# Patient Record
Sex: Male | Born: 1950 | Race: White | Hispanic: No | Marital: Single | Smoking: Current every day smoker
Health system: Southern US, Community
[De-identification: ages and names within clinical notes are randomized; demographics above are authoritative.]

## PROBLEM LIST (undated history)

## (undated) DIAGNOSIS — I7090 Unspecified atherosclerosis: Secondary | ICD-10-CM

## (undated) DIAGNOSIS — G250 Essential tremor: Secondary | ICD-10-CM

## (undated) HISTORY — DX: Essential tremor: G25.0

## (undated) HISTORY — DX: Unspecified atherosclerosis: I70.90

---

## 2019-12-03 ENCOUNTER — Emergency Department (INDEPENDENT_AMBULATORY_CARE_PROVIDER_SITE_OTHER)
Admission: EM | Admit: 2019-12-03 | Discharge: 2019-12-03 | Disposition: A | Discharge status: Home / Self Care | Payer: Medicare Other | Source: Home / Self Care | Attending: Emergency Medicine | Admitting: Emergency Medicine

## 2019-12-03 ENCOUNTER — Other Ambulatory Visit: Payer: Self-pay

## 2019-12-03 ENCOUNTER — Encounter: Payer: Self-pay | Admitting: Emergency Medicine

## 2019-12-03 DIAGNOSIS — M79604 Pain in right leg: Secondary | ICD-10-CM

## 2019-12-03 DIAGNOSIS — I10 Essential (primary) hypertension: Secondary | ICD-10-CM

## 2019-12-03 DIAGNOSIS — I70219 Atherosclerosis of native arteries of extremities with intermittent claudication, unspecified extremity: Secondary | ICD-10-CM

## 2019-12-03 MED ORDER — AMLODIPINE BESYLATE 2.5 MG PO TABS
2.5000 mg | ORAL_TABLET | Freq: Every day | ORAL | 0 refills | Status: DC
Start: 2019-12-03 — End: 2019-12-04

## 2019-12-03 NOTE — Discharge Instructions (Addendum)
You likely have "intermittent claudication" from decreased circulation right leg.  No sign that you are having an acute stroke right now.  You also have high blood pressure.  For now, starting you on Norvasc/amlodipine, 1 a day to help blood pressure and circulation.  You need to establish with and follow-up with PCP within 1 to 2 days. If any worsening severe symptoms, go to emergency room immediately. Please read attached instruction sheets on hypertension and claudication.

## 2019-12-03 NOTE — ED Provider Notes (Signed)
[facility]    CSN: [id number] Arrival date & time: 12/03/19  0930      History   Chief Complaint Chief Complaint  Patient presents with  . Leg Problem  Jason Carson complains of R leg "weak" x 5 days.  HPI Jason Carson is a 69 y.o. male.   HPI As a background, he has been active all his life.  Noted both legs have felt "tired" in the past weeks, but complains of new right leg "weakness", onset 5 days ago.  Recalls no trauma or change of activity. While walking around 5 days ago, felt some vague numbness right anterior groin area, but that resolved on its own within a few minutes. Then, 4 days ago he awoke and felt a vague heaviness diffusely right leg, but that resolved 1 hour later on its own.  Since then, if he walks 20 feet, entire right leg feels a dull ache and feels like it is weak but it does not give out.  Feels like right leg "has already run a mile". Occ numbness R foot , but not now. Noted coolness in R foot.  He has no established PCP. Denies any history of stroke or cardiovascular disease, but after questioning, he admits he was told when he went to the dentist that BP elevated, but he never established to have it rechecked as a PCP.  Smokes a pack a day.  Drinks alcohol, but not to excess.  Pertinent negative review of systems: No new shortness of breath or cough.  No syncope or headache or vision change.  Denies fever or chills or nausea or vomiting.  He has been fully vaccinated for Covid months ago.  Denies any exposure to anyone with known Covid diagnosis or symptoms.  Denies any significant back or hip pain.  History reviewed. No pertinent past medical history.  There are no problems to display for this patient.   History reviewed. No pertinent surgical history.     Home Medications    Prior to Admission medications   Medication Sig Start Date End Date Taking? Authorizing Provider  amLODipine (NORVASC) 2.5 MG tablet Take 1 tablet (2.5 mg  total) by mouth daily. For blood pressure and to improve circulation right leg.  Any further refills would need to be done by PCP. 12/03/19   Lajean Manes, MD    Family History Family History  Problem Relation Age of Onset  . Hypertension Mother   . Hyperlipidemia Mother     Social History Social History   Tobacco Use  . Smoking status: Current Every Day Smoker    Types: Cigarettes  . Smokeless tobacco: Never Used  Vaping Use  . Vaping Use: Never used  Substance Use Topics  . Alcohol use: Yes  . Drug use: Not on file     Allergies   Patient has no known allergies.   Review of Systems Review of Systems  Pertinent items noted in HPI and remainder of comprehensive ROS otherwise negative.  Physical Exam Triage Vital Signs ED Triage Vitals  Enc Vitals Group     BP 12/03/19 1039 (!) 184/93     Pulse Rate 12/03/19 1039 77     Resp 12/03/19 1039 18     Temp 12/03/19 1039 99.1 F (37.3 C)     Temp Source 12/03/19 1039 Oral     SpO2 12/03/19 1039 96 %     Weight 12/03/19 1040 170 lb (77.1 kg)     Height 12/03/19 1040 6' (  1.829 m)     Head Circumference --      Peak Flow --      Pain Score 12/03/19 1040 0     Pain Loc --      Pain Edu? --      Excl. in GC? --    No data found.  Updated Vital Signs BP (!) 184/93 (BP Location: Right Arm)   Pulse 77   Temp 99.1 F (37.3 C) (Oral)   Resp 18   Ht 6' (1.829 m)   Wt 77.1 kg   SpO2 96%   BMI 23.06 kg/m   Visual Acuity Right Eye Distance:   Left Eye Distance:   Bilateral Distance:  Grossly intact    Physical Exam Vitals reviewed.  Constitutional:      General: He is not in acute distress.    Appearance: He is well-developed.  HENT:     Head: Normocephalic and atraumatic.     Mouth/Throat:     Pharynx: Oropharynx is clear.  Eyes:     General: No scleral icterus.       Right eye: No discharge.        Left eye: No discharge.     Extraocular Movements: Extraocular movements intact.     Pupils: Pupils  are equal, round, and reactive to light.  Cardiovascular:     Rate and Rhythm: Normal rate and regular rhythm.  Pulmonary:     Effort: Pulmonary effort is normal.     Breath sounds: Normal breath sounds.  Abdominal:     General: There is no distension.     Tenderness: There is no abdominal tenderness.  Musculoskeletal:        General: No tenderness, deformity or signs of injury. Normal range of motion.     Cervical back: Normal range of motion and neck supple.     Right lower leg: No edema.     Left lower leg: No edema.     Right foot: Normal capillary refill. No swelling, foot drop or tenderness.     Left foot: Normal capillary refill. No swelling, foot drop or tenderness.     Comments: Left foot: DP pulse 2+.  Capillary refill normal.  Normal temperature. Right foot: DP pulse 1+ capillary refill normal.  TEMPERATURE R FOOT mildly COOLER COMPARED TO L.   Skin:    General: Skin is warm and dry.     Coloration: Skin is not jaundiced.     Findings: No rash.  Neurological:     General: No focal deficit present.     Mental Status: He is alert and oriented to person, place, and time.     Cranial Nerves: No cranial nerve deficit.     Sensory: No sensory deficit.     Coordination: Coordination normal.     Comments: Gait within normal limits.  Romberg negative. DTRs 2+ and equal bilaterally patella and Achilles. Specific motor testing of hip flexors and flexors and extension of knees and ankles are symmetric, WNL without any motor deficits of the lower extremities.  Psychiatric:        Behavior: Behavior normal.      UC Treatments / Results  Labs (all labs ordered are listed, but only abnormal results are displayed) Labs Reviewed - No data to display  EKG   Radiology No results found.  Procedures Procedures (including critical care time)  Medications Ordered in UC Medications - No data to display  Initial Impression / Assessment and Plan / UC  Course  I have reviewed  the triage vital signs and the nursing notes.  Pertinent labs & imaging results that were available during my care of the patient were reviewed by me and considered in my medical decision making (see chart for details).      Final Clinical Impressions(s) / UC Diagnoses   Final diagnoses:  Right leg pain  Atherosclerotic peripheral vascular disease with intermittent claudication (HCC)  Essential hypertension  5 days of vague weakness right lower extremity intermittently, worse with walking.  Motor strength and neurologic exam normal, no evidence of acute stroke. He does have significant elevated BP which places him at risk for cardiovascular event. His history and physical exam is more compatible with claudication from peripheral vascular disease, risk factors of elevated BP and smoking.  Discussed with him at length and explained red flags that would require immediate evaluation at emergency room. Starting on low-dose amlodipine, hopefully that will help lower BP and, as a vasodilator, hopefully will help peripheral vascular disease. Urged him to quit smoking. Urged him to establish with PCP within the next 2 days and our nurses will attempt to help facilitate that.-See other instructions below.  Questions invited and answered.   Discharge Instructions     You likely have "intermittent claudication" from decreased circulation right leg.  No sign that you are having an acute stroke right now.  You also have high blood pressure.  For now, starting you on Norvasc/amlodipine, 1 a day to help blood pressure and circulation.  You need to establish with and follow-up with PCP within 1 to 2 days. If any worsening severe symptoms, go to emergency room immediately. Please read attached instruction sheets on hypertension and claudication.    ED Prescriptions    Medication Sig Dispense Auth. Provider   amLODipine (NORVASC) 2.5 MG tablet Take 1 tablet (2.5 mg total) by mouth daily. For blood  pressure and to improve circulation right leg.  Any further refills would need to be done by PCP. 15 tablet Lajean Manes, MD     PDMP not reviewed this encounter.   Lajean Manes, MD 12/03/19 1539

## 2019-12-03 NOTE — ED Triage Notes (Signed)
RT leg is weak x 5 days. Numbness started at his groin and moved downward. He walked around the house but it was like dead weight. Not painful but numb.

## 2019-12-04 ENCOUNTER — Ambulatory Visit (INDEPENDENT_AMBULATORY_CARE_PROVIDER_SITE_OTHER): Payer: Medicare Other | Admitting: Medical-Surgical

## 2019-12-04 ENCOUNTER — Encounter: Payer: Self-pay | Admitting: Medical-Surgical

## 2019-12-04 VITALS — BP 167/100 | HR 84 | Temp 98.7°F | Ht 70.0 in | Wt 171.9 lb

## 2019-12-04 DIAGNOSIS — I10 Essential (primary) hypertension: Secondary | ICD-10-CM | POA: Diagnosis not present

## 2019-12-04 DIAGNOSIS — R202 Paresthesia of skin: Secondary | ICD-10-CM

## 2019-12-04 DIAGNOSIS — Z1159 Encounter for screening for other viral diseases: Secondary | ICD-10-CM | POA: Diagnosis not present

## 2019-12-04 DIAGNOSIS — M79604 Pain in right leg: Secondary | ICD-10-CM | POA: Diagnosis not present

## 2019-12-04 MED ORDER — AMLODIPINE BESYLATE 5 MG PO TABS
5.0000 mg | ORAL_TABLET | Freq: Every day | ORAL | 1 refills | Status: DC
Start: 2019-12-04 — End: 2020-02-09

## 2019-12-04 NOTE — Patient Instructions (Addendum)
Take Amlodipine 2.5mg  daily for 1 week then increase to 5mg  daily.  Check blood pressure daily and keep a log. Goal blood pressure 130/80.  Bring your log and your cuff to your nurse visit in 3 weeks.   DASH Eating Plan DASH stands for "Dietary Approaches to Stop Hypertension." The DASH eating plan is a healthy eating plan that has been shown to reduce high blood pressure (hypertension). It may also reduce your risk for type 2 diabetes, heart disease, and stroke. The DASH eating plan may also help with weight loss. What are tips for following this plan?  General guidelines  Avoid eating more than 2,300 mg (milligrams) of salt (sodium) a day. If you have hypertension, you may need to reduce your sodium intake to 1,500 mg a day.  Limit alcohol intake to no more than 1 drink a day for nonpregnant women and 2 drinks a day for men. One drink equals 12 oz of beer, 5 oz of wine, or 1 oz of hard liquor.  Work with your health care provider to maintain a healthy body weight or to lose weight. Ask what an ideal weight is for you.  Get at least 30 minutes of exercise that causes your heart to beat faster (aerobic exercise) most days of the week. Activities may include walking, swimming, or biking.  Work with your health care provider or diet and nutrition specialist (dietitian) to adjust your eating plan to your individual calorie needs. Reading food labels   Check food labels for the amount of sodium per serving. Choose foods with less than 5 percent of the Daily Value of sodium. Generally, foods with less than 300 mg of sodium per serving fit into this eating plan.  To find whole grains, look for the word "whole" as the first word in the ingredient list. Shopping  Buy products labeled as "low-sodium" or "no salt added."  Buy fresh foods. Avoid canned foods and premade or frozen meals. Cooking  Avoid adding salt when cooking. Use salt-free seasonings or herbs instead of table salt or sea  salt. Check with your health care provider or pharmacist before using salt substitutes.  Do not fry foods. Cook foods using healthy methods such as baking, boiling, grilling, and broiling instead.  Cook with heart-healthy oils, such as olive, canola, soybean, or sunflower oil. Meal planning  Eat a balanced diet that includes: ? 5 or more servings of fruits and vegetables each day. At each meal, try to fill half of your plate with fruits and vegetables. ? Up to 6-8 servings of whole grains each day. ? Less than 6 oz of lean meat, poultry, or fish each day. A 3-oz serving of meat is about the same size as a deck of cards. One egg equals 1 oz. ? 2 servings of low-fat dairy each day. ? A serving of nuts, seeds, or beans 5 times each week. ? Heart-healthy fats. Healthy fats called Omega-3 fatty acids are found in foods such as flaxseeds and coldwater fish, like sardines, salmon, and mackerel.  Limit how much you eat of the following: ? Canned or prepackaged foods. ? Food that is high in trans fat, such as fried foods. ? Food that is high in saturated fat, such as fatty meat. ? Sweets, desserts, sugary drinks, and other foods with added sugar. ? Full-fat dairy products.  Do not salt foods before eating.  Try to eat at least 2 vegetarian meals each week.  Eat more home-cooked food and less restaurant, buffet,  and fast food.  When eating at a restaurant, ask that your food be prepared with less salt or no salt, if possible. What foods are recommended? The items listed may not be a complete list. Talk with your dietitian about what dietary choices are best for you. Grains Whole-grain or whole-wheat bread. Whole-grain or whole-wheat pasta. Brown rice. [place]. Bulgur. Whole-grain and low-sodium cereals. Pita bread. Low-fat, low-sodium crackers. Whole-wheat flour tortillas. Vegetables Fresh or frozen vegetables (raw, steamed, roasted, or grilled). Low-sodium or reduced-sodium tomato  and vegetable juice. Low-sodium or reduced-sodium tomato sauce and tomato paste. Low-sodium or reduced-sodium canned vegetables. Fruits All fresh, dried, or frozen fruit. Canned fruit in natural juice (without added sugar). Meat and other protein foods Skinless chicken or [place]. Ground chicken or [place]. Pork with fat trimmed off. Fish and seafood. Egg whites. Dried beans, peas, or lentils. Unsalted nuts, nut butters, and seeds. Unsalted canned beans. Lean cuts of beef with fat trimmed off. Low-sodium, lean deli meat. Dairy Low-fat (1%) or fat-free (skim) milk. Fat-free, low-fat, or reduced-fat cheeses. Nonfat, low-sodium ricotta or cottage cheese. Low-fat or nonfat yogurt. Low-fat, low-sodium cheese. Fats and oils Soft margarine without trans fats. Vegetable oil. Low-fat, reduced-fat, or light mayonnaise and salad dressings (reduced-sodium). Canola, safflower, olive, soybean, and sunflower oils. Avocado. Seasoning and other foods Herbs. Spices. Seasoning mixes without salt. Unsalted popcorn and pretzels. Fat-free sweets. What foods are not recommended? The items listed may not be a complete list. Talk with your dietitian about what dietary choices are best for you. Grains Baked goods made with fat, such as croissants, muffins, or some breads. Dry pasta or rice meal packs. Vegetables Creamed or fried vegetables. Vegetables in a cheese sauce. Regular canned vegetables (not low-sodium or reduced-sodium). Regular canned tomato sauce and paste (not low-sodium or reduced-sodium). Regular tomato and vegetable juice (not low-sodium or reduced-sodium). Angie Fava. Olives. Fruits Canned fruit in a light or heavy syrup. Fried fruit. Fruit in cream or butter sauce. Meat and other protein foods Fatty cuts of meat. Ribs. Fried meat. Berniece Salines. Sausage. Bologna and other processed lunch meats. Salami. Fatback. Hotdogs. Bratwurst. Salted nuts and seeds. Canned beans with added salt. Canned or smoked fish. Whole eggs  or egg yolks. Chicken or [place] with skin. Dairy Whole or 2% milk, cream, and half-and-half. Whole or full-fat cream cheese. Whole-fat or sweetened yogurt. Full-fat cheese. Nondairy creamers. Whipped toppings. Processed cheese and cheese spreads. Fats and oils Butter. Stick margarine. Lard. Shortening. Ghee. Bacon fat. Tropical oils, such as coconut, palm kernel, or palm oil. Seasoning and other foods Salted popcorn and pretzels. Onion salt, garlic salt, seasoned salt, table salt, and sea salt. Worcestershire sauce. Tartar sauce. Barbecue sauce. Teriyaki sauce. Soy sauce, including reduced-sodium. Steak sauce. Canned and packaged gravies. Fish sauce. Oyster sauce. Cocktail sauce. Horseradish that you find on the shelf. Ketchup. Mustard. Meat flavorings and tenderizers. Bouillon cubes. Hot sauce and Tabasco sauce. Premade or packaged marinades. Premade or packaged taco seasonings. Relishes. Regular salad dressings. Where to find more information:  National Heart, Lung, and [place]: https://wilson-eaton.com/  American Heart Association: www.heart.org Summary  The DASH eating plan is a healthy eating plan that has been shown to reduce high blood pressure (hypertension). It may also reduce your risk for type 2 diabetes, heart disease, and stroke.  With the DASH eating plan, you should limit salt (sodium) intake to 2,300 mg a day. If you have hypertension, you may need to reduce your sodium intake to 1,500 mg a day.  When on  the DASH eating plan, aim to eat more fresh fruits and vegetables, whole grains, lean proteins, low-fat dairy, and heart-healthy fats.  Work with your health care provider or diet and nutrition specialist (dietitian) to adjust your eating plan to your individual calorie needs. This information is not intended to replace advice given to you by your health care provider. Make sure you discuss any questions you have with your health care provider. Document Revised: 04/27/2017  Document Reviewed: 05/08/2016 Elsevier Patient Education  2020 [employer].

## 2019-12-04 NOTE — Progress Notes (Signed)
New Patient Office Visit  Subjective:  Patient ID: Jason Carson, male    DOB: Oct 26, 1950  Age: 69 y.o. MRN: [id number]  CC:  Chief Complaint  Patient presents with  . Establish Care  . Follow-up    [facility]  . Hypertension  . Leg Pain    HPI Jason Carson is a pleasant 68 year old male presenting today to establish care. He was seen yesterday at [facility] for right leg pain/weakness/paresthesia. He also had an incidental finding of hypertension. UC started him on Amlodipine 2.5mg  daily. He took his first dose yesterday, tolerated well without side effects. He does not note any change in his leg pain. Blood pressure still elevated today.   Patient admits to receiving no primary or preventative care for many years. Cannot remember when he had his last physical exam. History of benign essential tremor that affects bilateral hands and occasionally his voice. Was given a medication about 30 years ago that he tried but stopped as he didn't like the way it made him feel. Tremor is untreated but he describes it as "annoying but not debilitating". Also has a history of severe periodontal disease and uses chlorhexidine mouth rinse daily per his dentist's instructions.   Right lower extremity symptoms started on Saturday evening with a small area of his upper thigh near the inguinal crease going numb. He was able to feel sensation above and below the spot at that time. The numbness resolved and he went to bed. On awakening Sunday morning, his entire right leg was numb. This did resolve itself shortly after and since then he has had numbness/tingling in the lower leg. Pain occurs with ambulation and he is unable to walk for any distance before requiring rest. Normally is very active but now is riding his golf cart around his property to stay occupied.  Past Medical History:  Diagnosis Date  . Atherosclerosis   . Benign essential tremor     History reviewed. No pertinent surgical  history.  Family History  Problem Relation Age of Onset  . Hypertension Mother   . Hyperlipidemia Mother     Social History   Socioeconomic History  . Marital status: Divorced    Spouse name: Not on file  . Number of children: Not on file  . Years of education: Not on file  . Highest education level: Not on file  Occupational History  . Not on file  Tobacco Use  . Smoking status: Current Every Day Smoker    Packs/day: 1.25    Types: Cigarettes  . Smokeless tobacco: Never Used  Vaping Use  . Vaping Use: Never used  Substance and Sexual Activity  . Alcohol use: Yes    Alcohol/week: 10.0 - 12.0 standard drinks    Types: 10 - 12 Standard drinks or equivalent per week  . Drug use: Never  . Sexual activity: Yes  Other Topics Concern  . Not on file  Social History Narrative  . Not on file   Social Determinants of Health   Financial Resource Strain:   . Difficulty of Paying Living Expenses:   Food Insecurity:   . Worried About Programme researcher, broadcasting/film/video in the Last Year:   . Barista in the Last Year:   Transportation Needs:   . Freight forwarder (Medical):   Marland Kitchen Lack of Transportation (Non-Medical):   Physical Activity:   . Days of Exercise per Week:   . Minutes of Exercise per Session:   Stress:   .  Feeling of Stress :   Social Connections:   . Frequency of Communication with Friends and Family:   . Frequency of Social Gatherings with Friends and Family:   . Attends Religious Services:   . Active Member of Clubs or Organizations:   . Attends Banker Meetings:   Marland Kitchen Marital Status:   Intimate Partner Violence:   . Fear of Current or Ex-Partner:   . Emotionally Abused:   Marland Kitchen Physically Abused:   . Sexually Abused:     ROS Review of Systems  Constitutional: Negative for chills, fatigue, fever and unexpected weight change.  Respiratory: Negative for cough, chest tightness, shortness of breath and wheezing.   Cardiovascular: Negative for chest  pain, palpitations and leg swelling.  Endocrine: Negative for cold intolerance and heat intolerance.  Neurological: Positive for weakness (right leg) and numbness (right lower leg). Negative for dizziness, light-headedness and headaches.  Psychiatric/Behavioral: Positive for sleep disturbance (frequent waking for nocturia). Negative for dysphoric mood, self-injury and suicidal ideas. The patient is not nervous/anxious.     Objective:   Today's Vitals: BP (!) 167/100   Pulse 84   Temp 98.7 F (37.1 C) (Oral)   Ht 5\' 10"  (1.778 m)   Wt 171 lb 14.4 oz (78 kg)   SpO2 97%   BMI 24.67 kg/m   Physical Exam Vitals reviewed.  Constitutional:      General: He is not in acute distress.    Appearance: Normal appearance. He is normal weight.  HENT:     Head: Normocephalic and atraumatic.  Neck:     Vascular: No carotid bruit.  Cardiovascular:     Rate and Rhythm: Normal rate and regular rhythm.     Pulses: Normal pulses.     Heart sounds: Normal heart sounds. No murmur heard.  No friction rub. No gallop.   Pulmonary:     Effort: No respiratory distress.     Breath sounds: Normal breath sounds.  Musculoskeletal:        General: No tenderness. Normal range of motion.     Cervical back: Normal range of motion and neck supple. No tenderness.     Right lower leg: No edema.     Left lower leg: No edema.  Skin:    General: Skin is warm and dry.  Neurological:     Mental Status: He is alert and oriented to person, place, and time.     Cranial Nerves: No cranial nerve deficit.     Gait: Gait normal.  Psychiatric:        Mood and Affect: Mood normal.        Behavior: Behavior normal.        Thought Content: Thought content normal.        Judgment: Judgment normal.     Assessment & Plan:   1. Essential hypertension Checking CBC, CMP, TSH, and Lipid panel today. Continue Amlodipine 2.5mg  daily for 1 week then increase to 5mg  dialy. Recommend obtaining a BP cuff that measures on the  upper arm. Check BP daily and maintain a log. Bring cuff and log to nurse visit after being on 5mg  dose for 2 weeks. DASH diet information provided with AVS.  - CBC - COMPLETE METABOLIC PANEL WITH GFR - Lipid panel - TSH  2. Need for hepatitis C screening test Discussed hepatitis C screening recommendations. Patient agreeable so adding to blood work today. - Hepatitis C antibody  3. Paresthesia of right lower extremity Intermittent claudication suspected by UC  provider which may see improvement with CCB therapy. Checking Vitamin B12 and CK.  - Vitamin B12   Outpatient Encounter Medications as of 12/04/2019  Medication Sig  . amLODipine (NORVASC) 5 MG tablet Take 1 tablet (5 mg total) by mouth daily.  . [DISCONTINUED] amLODipine (NORVASC) 2.5 MG tablet Take 1 tablet (2.5 mg total) by mouth daily. For blood pressure and to improve circulation right leg.  Any further refills would need to be done by PCP.   No facility-administered encounter medications on file as of 12/04/2019.    Follow-up: Return in about 3 weeks (around 12/25/2019) for nurse visit for BP check.   Thayer Ohm, DNP, APRN, FNP-BC [facility] and Sports Medicine

## 2019-12-10 ENCOUNTER — Encounter: Payer: Self-pay | Admitting: Medical-Surgical

## 2019-12-10 LAB — COMPLETE METABOLIC PANEL WITH GFR
AG Ratio: 2.1 (calc) (ref 1.0–2.5)
ALT: 15 U/L (ref 9–46)
AST: 15 U/L (ref 10–35)
Albumin: 4.4 g/dL (ref 3.6–5.1)
Alkaline phosphatase (APISO): 66 U/L (ref 35–144)
BUN: 11 mg/dL (ref 7–25)
CO2: 29 mmol/L (ref 20–32)
Calcium: 9.4 mg/dL (ref 8.6–10.3)
Chloride: 98 mmol/L (ref 98–110)
Creat: 0.99 mg/dL (ref 0.70–1.25)
GFR, Est African American: 90 mL/min/{1.73_m2} (ref >=60)
GFR, Est Non African American: 77 mL/min/{1.73_m2} (ref >=60)
Globulin: 2.1 g/dL (calc) (ref 1.9–3.7)
Glucose, Bld: 101 mg/dL — ABNORMAL HIGH (ref 65–99)
Potassium: 5.5 mmol/L — ABNORMAL HIGH (ref 3.5–5.3)
Sodium: 135 mmol/L (ref 135–146)
Total Bilirubin: 0.6 mg/dL (ref 0.2–1.2)
Total Protein: 6.5 g/dL (ref 6.1–8.1)

## 2019-12-10 LAB — CBC
HCT: 47.5 % (ref 38.5–50.0)
Hemoglobin: 16.9 g/dL (ref 13.2–17.1)
MCH: 36.1 pg — ABNORMAL HIGH (ref 27.0–33.0)
MCHC: 35.6 g/dL (ref 32.0–36.0)
MCV: 101.5 fL — ABNORMAL HIGH (ref 80.0–100.0)
MPV: 10.4 fL (ref 7.5–12.5)
Platelets: 244 10*3/uL (ref 140–400)
RBC: 4.68 10*6/uL (ref 4.20–5.80)
RDW: 11.7 % (ref 11.0–15.0)
WBC: 6.1 10*3/uL (ref 3.8–10.8)

## 2019-12-10 LAB — LIPID PANEL
Cholesterol: 191 mg/dL (ref <200)
HDL: 63 mg/dL (ref >=40)
LDL Cholesterol (Calc): 114 mg/dL (calc) — ABNORMAL HIGH
Non-HDL Cholesterol (Calc): 128 mg/dL (calc) (ref <130)
Total CHOL/HDL Ratio: 3 (calc) (ref <5.0)
Triglycerides: 56 mg/dL (ref <150)

## 2019-12-10 LAB — HEPATITIS C ANTIBODY
Hepatitis C Ab: NONREACTIVE
SIGNAL TO CUT-OFF: 0.01 (ref <1.00)

## 2019-12-10 LAB — CK: Total CK: 57 U/L (ref 44–196)

## 2019-12-10 LAB — TSH: TSH: 1.53 mIU/L (ref 0.40–4.50)

## 2019-12-10 LAB — VITAMIN B12: Vitamin B-12: 259 pg/mL (ref 200–1100)

## 2019-12-24 ENCOUNTER — Other Ambulatory Visit: Payer: Self-pay | Admitting: Medical-Surgical

## 2019-12-24 ENCOUNTER — Encounter: Payer: Self-pay | Admitting: Medical-Surgical

## 2019-12-24 DIAGNOSIS — R202 Paresthesia of skin: Secondary | ICD-10-CM

## 2019-12-24 NOTE — Progress Notes (Signed)
Continued paresthesias and pain to the right lower extremity. Ordering ABIs to evaluate lower extremity blood flow.

## 2019-12-25 ENCOUNTER — Ambulatory Visit (INDEPENDENT_AMBULATORY_CARE_PROVIDER_SITE_OTHER): Payer: Medicare Other | Admitting: Medical-Surgical

## 2019-12-25 ENCOUNTER — Other Ambulatory Visit: Payer: Self-pay

## 2019-12-25 VITALS — BP 168/105 | HR 89

## 2019-12-25 DIAGNOSIS — I10 Essential (primary) hypertension: Secondary | ICD-10-CM | POA: Diagnosis not present

## 2019-12-25 NOTE — Progress Notes (Signed)
See message from patient with attachment sent on 12/24/2019 for home blood pressure log.   Once medication taken, blood pressures down to goal throughout the day. Continue same dose for now. Will follow up in 1 month.   Thayer Ohm, DNP, APRN, FNP-BC [facility] and [facility] Medicine

## 2019-12-25 NOTE — Progress Notes (Signed)
   Subjective:    Patient ID: Jason Carson, male    DOB: November 28, 1950, 68 y.o.   MRN: [id number]  HPI Patient is here for blood pressure and weight check. Denies trouble sleeping, palpitations, or medication problems. Takes Amlodipine 5 mg QD between 10 AM and 12 PM.     Review of Systems     Objective:   Physical Exam        Assessment & Plan:  Patient's initial blood pressure reading was 178/92. Patient confirmed he has NOT taken his blood pressure medication this morning. Patient's home BP machine read at 168/105. Advised patient this is considered in range with out machine.   Readings as well as diagram patient had sent on MyChart were reviewed with provider. Patient advised to take blood pressure medication earlier in the morning and wait 30 - 60 minutes after taking medication to check blood pressure. I reviewed steps and variables with taking blood pressures at home.   Patient instructed to follow up with PCP in about 1 month for blood pressure.   HM: up to date

## 2019-12-29 ENCOUNTER — Other Ambulatory Visit: Payer: Self-pay | Admitting: Medical-Surgical

## 2019-12-29 ENCOUNTER — Encounter: Payer: Self-pay | Admitting: Medical-Surgical

## 2019-12-29 DIAGNOSIS — R202 Paresthesia of skin: Secondary | ICD-10-CM

## 2020-01-07 ENCOUNTER — Other Ambulatory Visit: Payer: Self-pay

## 2020-01-07 ENCOUNTER — Other Ambulatory Visit: Payer: Self-pay | Admitting: Medical-Surgical

## 2020-01-07 ENCOUNTER — Ambulatory Visit (HOSPITAL_COMMUNITY)
Admission: RE | Admit: 2020-01-07 | Discharge: 2020-01-07 | Disposition: A | Discharge status: Home / Self Care | Payer: Medicare Other | Source: Ambulatory Visit | Attending: Medical-Surgical | Admitting: Medical-Surgical

## 2020-01-07 DIAGNOSIS — R202 Paresthesia of skin: Secondary | ICD-10-CM | POA: Insufficient documentation

## 2020-01-07 DIAGNOSIS — M79604 Pain in right leg: Secondary | ICD-10-CM

## 2020-01-07 DIAGNOSIS — R6889 Other general symptoms and signs: Secondary | ICD-10-CM

## 2020-01-07 NOTE — Progress Notes (Signed)
Results relayed to Marylene Land to be given to [place], NP.

## 2020-01-07 NOTE — Progress Notes (Signed)
Call received from vascular lab during completion of ABI testing.  Right lower extremity ABI 0.44.  Entering urgent referral to vascular surgery for further evaluation and management.  MyChart message sent to patient for notification of results and referral status.

## 2020-01-08 ENCOUNTER — Encounter: Payer: Self-pay | Admitting: Medical-Surgical

## 2020-01-19 ENCOUNTER — Encounter: Payer: Self-pay | Admitting: Medical-Surgical

## 2020-01-20 ENCOUNTER — Ambulatory Visit (INDEPENDENT_AMBULATORY_CARE_PROVIDER_SITE_OTHER): Payer: Medicare Other | Admitting: Vascular Surgery

## 2020-01-20 ENCOUNTER — Other Ambulatory Visit: Payer: Self-pay

## 2020-01-20 ENCOUNTER — Encounter: Payer: Self-pay | Admitting: Vascular Surgery

## 2020-01-20 DIAGNOSIS — I998 Other disorder of circulatory system: Secondary | ICD-10-CM

## 2020-01-20 DIAGNOSIS — I70229 Atherosclerosis of native arteries of extremities with rest pain, unspecified extremity: Secondary | ICD-10-CM | POA: Insufficient documentation

## 2020-01-20 HISTORY — DX: Atherosclerosis of native arteries of extremities with rest pain, unspecified extremity: I70.229

## 2020-01-20 NOTE — Progress Notes (Signed)
Patient name: Jason Carson MRN: [id number] DOB: January 27, 1951 Sex: male  REASON FOR CONSULT: Right lower extremity paresthesias with pain limiting mobility  HPI: Jason Carson is a 69 y.o. male, with history of tobacco abuse that presents for evaluation of right lower extremity paresthesias and pain limiting his mobility. He states all of this started July 4 of this year when he started having some numbness and tingling in the right thigh. He states he can feel it from his buttock all the way into the toe. This severely limits his mobility and he can only walk about 20 feet without stopping. He states he has numbness in the distal half of his foot into all five toes. Pain in the great toe that wakes him up at night. No active tissue loss at this time. Has been smoking a pack a day for 50 years. He lives alone. No previous lower extremity interventions. States really does not have any other medical problems.  Past Medical History:  Diagnosis Date  . Atherosclerosis   . Benign essential tremor     History reviewed. No pertinent surgical history.  Family History  Problem Relation Age of Onset  . Hypertension Mother   . Hyperlipidemia Mother     SOCIAL HISTORY: Social History   Socioeconomic History  . Marital status: Single    Spouse name: Not on file  . Number of children: Not on file  . Years of education: Not on file  . Highest education level: Not on file  Occupational History  . Not on file  Tobacco Use  . Smoking status: Current Every Day Smoker    Packs/day: 1.25    Types: Cigarettes  . Smokeless tobacco: Never Used  Vaping Use  . Vaping Use: Never used  Substance and Sexual Activity  . Alcohol use: Yes    Alcohol/week: 10.0 - 12.0 standard drinks    Types: 10 - 12 Standard drinks or equivalent per week  . Drug use: Never  . Sexual activity: Yes  Other Topics Concern  . Not on file  Social History Narrative  . Not on file   Social Determinants of Health    Financial Resource Strain:   . Difficulty of Paying Living Expenses: Not on file  Food Insecurity:   . Worried About Programme researcher, broadcasting/film/video in the Last Year: Not on file  . Ran Out of Food in the Last Year: Not on file  Transportation Needs:   . Lack of Transportation (Medical): Not on file  . Lack of Transportation (Non-Medical): Not on file  Physical Activity:   . Days of Exercise per Week: Not on file  . Minutes of Exercise per Session: Not on file  Stress:   . Feeling of Stress : Not on file  Social Connections:   . Frequency of Communication with Friends and Family: Not on file  . Frequency of Social Gatherings with Friends and Family: Not on file  . Attends Religious Services: Not on file  . Active Member of Clubs or Organizations: Not on file  . Attends Banker Meetings: Not on file  . Marital Status: Not on file  Intimate Partner Violence:   . Fear of Current or Ex-Partner: Not on file  . Emotionally Abused: Not on file  . Physically Abused: Not on file  . Sexually Abused: Not on file    No Known Allergies  Current Outpatient Medications  Medication Sig Dispense Refill  . amLODipine (NORVASC) 5 MG tablet Take  1 tablet (5 mg total) by mouth daily. 30 tablet 1   No current facility-administered medications for this visit.    REVIEW OF SYSTEMS:  [X]  denotes positive finding, [ ]  denotes negative finding Cardiac  Comments:  Chest pain or chest pressure:    Shortness of breath upon exertion:    Short of breath when lying flat:    Irregular heart rhythm:        Vascular    Pain in calf, thigh, or hip brought on by ambulation: x Right  Pain in feet at night that wakes you up from your sleep:  x Right  Blood clot in your veins:    Leg swelling:         Pulmonary    Oxygen at home:    Productive cough:     Wheezing:         Neurologic    Sudden weakness in arms or legs:     Sudden numbness in arms or legs:     Sudden onset of difficulty speaking  or slurred speech:    Temporary loss of vision in one eye:     Problems with dizziness:         Gastrointestinal    Blood in stool:     Vomited blood:         Genitourinary    Burning when urinating:     Blood in urine:        Psychiatric    Major depression:         Hematologic    Bleeding problems:    Problems with blood clotting too easily:        Skin    Rashes or ulcers:        Constitutional    Fever or chills:      PHYSICAL EXAM: Vitals:   01/20/20 1104  BP: (!) 143/83  Pulse: 86  Resp: 16  Temp: (!) 97.5 F (36.4 C)  SpO2: 95%  Weight: 174 lb (78.9 kg)  Height: 6' (1.829 m)    GENERAL: The patient is a well-nourished male, in no acute distress. The vital signs are documented above. CARDIAC: There is a regular rate and rhythm.  VASCULAR:  Left femoral pulse 2+ palpable Right femoral pulse very weak No palpable pulses in the right foot Left PT 2+ palpable No lower extremity tissue loss PULMONARY: There is good air exchange bilaterally without wheezing or rales. ABDOMEN: Soft and non-tender with normal pitched bowel sounds.  MUSCULOSKELETAL: There are no major deformities or cyanosis. NEUROLOGIC: No focal weakness or paresthesias are detected. SKIN: There are no ulcers or rashes noted. PSYCHIATRIC: The patient has a normal affect.  DATA:   ABIs from 01/07/2020 are 0.44 on the right monophasic and 1.07 on the left triphasic  Assessment/Plan:  69 year old male presents with critical limb ischemia of the right lower extremity with rest pain. His ABI on the right are severely reduced at 0.44 monophasic at the ankle. He has a very weak right femoral pulse and suspect he has some component of iliac disease or common femoral disease. I subsequently recommended aortogram with lower extremity arteriogram and possible intervention. We talked about if there is an option for endovascular intervention we will try and intervene at the time of arteriogram. Sometimes  open surgery and or bypass is required at a later date.  Risk and benefits were discussed in detail including risk of groin access complication or vessel injury etc. Discussed this would be done  under moderate sedation at [facility] in the Cath Lab. We will get him scheduled for next week. I discussed the importance of smoking cessation.   Cephus Shelling, MD [facility] Office: [phone]

## 2020-01-21 ENCOUNTER — Encounter: Payer: Self-pay | Admitting: *Deleted

## 2020-01-21 ENCOUNTER — Other Ambulatory Visit: Payer: Self-pay | Admitting: *Deleted

## 2020-01-26 ENCOUNTER — Ambulatory Visit: Payer: Medicare Other | Admitting: Medical-Surgical

## 2020-01-27 ENCOUNTER — Other Ambulatory Visit (HOSPITAL_COMMUNITY)
Admission: RE | Admit: 2020-01-27 | Discharge: 2020-01-27 | Disposition: A | Discharge status: Home / Self Care | Payer: Medicare Other | Source: Ambulatory Visit | Attending: Vascular Surgery | Admitting: Vascular Surgery

## 2020-01-27 DIAGNOSIS — Z20822 Contact with and (suspected) exposure to covid-19: Secondary | ICD-10-CM | POA: Insufficient documentation

## 2020-01-27 DIAGNOSIS — Z01812 Encounter for preprocedural laboratory examination: Secondary | ICD-10-CM | POA: Insufficient documentation

## 2020-01-27 LAB — SARS CORONAVIRUS 2 (TAT 6-24 HRS): SARS Coronavirus 2: NEGATIVE

## 2020-01-28 ENCOUNTER — Encounter (HOSPITAL_COMMUNITY)
Admission: RE | Disposition: A | Discharge status: Home / Self Care | Payer: Self-pay | Source: Home / Self Care | Attending: Vascular Surgery

## 2020-01-28 ENCOUNTER — Other Ambulatory Visit: Payer: Self-pay

## 2020-01-28 ENCOUNTER — Ambulatory Visit (HOSPITAL_COMMUNITY)
Admission: RE | Admit: 2020-01-28 | Discharge: 2020-01-28 | Disposition: A | Discharge status: Home / Self Care | Payer: Medicare Other | Attending: Vascular Surgery | Admitting: Vascular Surgery

## 2020-01-28 DIAGNOSIS — I70223 Atherosclerosis of native arteries of extremities with rest pain, bilateral legs: Secondary | ICD-10-CM | POA: Insufficient documentation

## 2020-01-28 DIAGNOSIS — I70212 Atherosclerosis of native arteries of extremities with intermittent claudication, left leg: Secondary | ICD-10-CM | POA: Diagnosis not present

## 2020-01-28 DIAGNOSIS — I70221 Atherosclerosis of native arteries of extremities with rest pain, right leg: Secondary | ICD-10-CM | POA: Diagnosis not present

## 2020-01-28 DIAGNOSIS — Z79899 Other long term (current) drug therapy: Secondary | ICD-10-CM | POA: Insufficient documentation

## 2020-01-28 DIAGNOSIS — G250 Essential tremor: Secondary | ICD-10-CM | POA: Insufficient documentation

## 2020-01-28 DIAGNOSIS — F17210 Nicotine dependence, cigarettes, uncomplicated: Secondary | ICD-10-CM | POA: Insufficient documentation

## 2020-01-28 HISTORY — PX: PERIPHERAL VASCULAR INTERVENTION: CATH118257

## 2020-01-28 HISTORY — PX: ABDOMINAL AORTOGRAM W/LOWER EXTREMITY: CATH118223

## 2020-01-28 LAB — POCT I-STAT, CHEM 8
BUN: 11 mg/dL (ref 8–23)
Calcium, Ion: 1.12 mmol/L — ABNORMAL LOW (ref 1.15–1.40)
Chloride: 103 mmol/L (ref 98–111)
Creatinine, Ser: 0.8 mg/dL (ref 0.61–1.24)
Glucose, Bld: 117 mg/dL — ABNORMAL HIGH (ref 70–99)
HCT: 50 % (ref 39.0–52.0)
Hemoglobin: 17 g/dL (ref 13.0–17.0)
Potassium: 4.1 mmol/L (ref 3.5–5.1)
Sodium: 138 mmol/L (ref 135–145)
TCO2: 25 mmol/L (ref 22–32)

## 2020-01-28 LAB — POCT ACTIVATED CLOTTING TIME
Activated Clotting Time: 279 seconds
Activated Clotting Time: 213 seconds
Activated Clotting Time: 153 seconds

## 2020-01-28 SURGERY — ABDOMINAL AORTOGRAM W/LOWER EXTREMITY
Anesthesia: LOCAL

## 2020-01-28 MED ORDER — CLOPIDOGREL BISULFATE 300 MG PO TABS
ORAL_TABLET | ORAL | Status: AC
  Filled 2020-01-28: qty 1

## 2020-01-28 MED ORDER — FENTANYL CITRATE (PF) 100 MCG/2ML IJ SOLN
INTRAMUSCULAR | Status: AC
  Filled 2020-01-28: qty 2

## 2020-01-28 MED ORDER — CLOPIDOGREL BISULFATE 75 MG PO TABS: 75.0000 mg | ORAL_TABLET | Freq: Every day | ORAL | 11 refills | Status: DC

## 2020-01-28 MED ORDER — LIDOCAINE HCL (PF) 1 % IJ SOLN
INTRAMUSCULAR | Status: AC
  Filled 2020-01-28: qty 30

## 2020-01-28 MED ORDER — SODIUM CHLORIDE 0.9% FLUSH: 3.0000 mL | INTRAVENOUS | Status: DC | PRN

## 2020-01-28 MED ORDER — SODIUM CHLORIDE 0.9 % IV SOLN: 250.0000 mL | INTRAVENOUS | Status: DC | PRN

## 2020-01-28 MED ORDER — ATORVASTATIN CALCIUM 10 MG PO TABS: 10.0000 mg | ORAL_TABLET | Freq: Every day | ORAL | 11 refills | Status: DC

## 2020-01-28 MED ORDER — LABETALOL HCL 5 MG/ML IV SOLN
10.0000 mg | INTRAVENOUS | Status: DC | PRN
  Administered 2020-01-28: 10 mg via INTRAVENOUS

## 2020-01-28 MED ORDER — LABETALOL HCL 5 MG/ML IV SOLN
INTRAVENOUS | Status: DC | PRN
  Administered 2020-01-28: 10 mg via INTRAVENOUS

## 2020-01-28 MED ORDER — IODIXANOL 320 MG/ML IV SOLN
INTRAVENOUS | Status: DC | PRN
  Administered 2020-01-28: 190 mL via INTRA_ARTERIAL

## 2020-01-28 MED ORDER — SODIUM CHLORIDE 0.9% FLUSH: 3.0000 mL | Freq: Two times a day (BID) | INTRAVENOUS | Status: DC

## 2020-01-28 MED ORDER — ATORVASTATIN CALCIUM 10 MG PO TABS
10.0000 mg | ORAL_TABLET | Freq: Every day | ORAL | Status: DC
  Administered 2020-01-28: 10 mg via ORAL
  Filled 2020-01-28 (×2): qty 1

## 2020-01-28 MED ORDER — MIDAZOLAM HCL 2 MG/2ML IJ SOLN
INTRAMUSCULAR | Status: AC
  Filled 2020-01-28: qty 2

## 2020-01-28 MED ORDER — HEPARIN (PORCINE) IN NACL 1000-0.9 UT/500ML-% IV SOLN
INTRAVENOUS | Status: AC
  Filled 2020-01-28: qty 1000

## 2020-01-28 MED ORDER — SODIUM CHLORIDE 0.9 % IV SOLN: INTRAVENOUS | Status: DC

## 2020-01-28 MED ORDER — LIDOCAINE HCL (PF) 1 % IJ SOLN
INTRAMUSCULAR | Status: DC | PRN
  Administered 2020-01-28: 13 mL
  Administered 2020-01-28: 14 mL

## 2020-01-28 MED ORDER — LABETALOL HCL 5 MG/ML IV SOLN
INTRAVENOUS | Status: AC
  Filled 2020-01-28: qty 4

## 2020-01-28 MED ORDER — HEPARIN (PORCINE) IN NACL 1000-0.9 UT/500ML-% IV SOLN
INTRAVENOUS | Status: DC | PRN
  Administered 2020-01-28 (×2): 500 mL

## 2020-01-28 MED ORDER — HYDRALAZINE HCL 20 MG/ML IJ SOLN: 5.0000 mg | INTRAMUSCULAR | Status: DC | PRN

## 2020-01-28 MED ORDER — HEPARIN SODIUM (PORCINE) 1000 UNIT/ML IJ SOLN
INTRAMUSCULAR | Status: AC
  Filled 2020-01-28: qty 1

## 2020-01-28 MED ORDER — MIDAZOLAM HCL 2 MG/2ML IJ SOLN
INTRAMUSCULAR | Status: DC | PRN
  Administered 2020-01-28 (×2): 1 mg via INTRAVENOUS

## 2020-01-28 MED ORDER — CLOPIDOGREL BISULFATE 75 MG PO TABS: 75.0000 mg | ORAL_TABLET | Freq: Every day | ORAL | Status: DC

## 2020-01-28 MED ORDER — FENTANYL CITRATE (PF) 100 MCG/2ML IJ SOLN
INTRAMUSCULAR | Status: DC | PRN
  Administered 2020-01-28 (×2): 50 ug via INTRAVENOUS

## 2020-01-28 MED ORDER — ACETAMINOPHEN 325 MG PO TABS: 650.0000 mg | ORAL_TABLET | ORAL | Status: DC | PRN

## 2020-01-28 MED ORDER — ASPIRIN EC 81 MG PO TBEC: 81.0000 mg | DELAYED_RELEASE_TABLET | Freq: Every day | ORAL | 11 refills | Status: AC

## 2020-01-28 MED ORDER — HEPARIN SODIUM (PORCINE) 1000 UNIT/ML IJ SOLN
INTRAMUSCULAR | Status: DC | PRN
  Administered 2020-01-28: 4000 [IU] via INTRAVENOUS
  Administered 2020-01-28: 5000 [IU] via INTRAVENOUS

## 2020-01-28 MED ORDER — CLOPIDOGREL BISULFATE 300 MG PO TABS
ORAL_TABLET | ORAL | Status: DC | PRN
  Administered 2020-01-28: 300 mg via ORAL

## 2020-01-28 MED ORDER — ONDANSETRON HCL 4 MG/2ML IJ SOLN: 4.0000 mg | Freq: Four times a day (QID) | INTRAMUSCULAR | Status: DC | PRN

## 2020-01-28 MED ORDER — CLOPIDOGREL BISULFATE 75 MG PO TABS: 300.0000 mg | ORAL_TABLET | Freq: Once | ORAL | Status: DC

## 2020-01-28 MED ORDER — SODIUM CHLORIDE 0.9 % WEIGHT BASED INFUSION: 1.0000 mL/kg/h | INTRAVENOUS | Status: DC

## 2020-01-28 MED ORDER — ASPIRIN EC 81 MG PO TBEC
81.0000 mg | DELAYED_RELEASE_TABLET | Freq: Every day | ORAL | Status: DC
  Administered 2020-01-28: 81 mg via ORAL
  Filled 2020-01-28: qty 1

## 2020-01-28 SURGICAL SUPPLY — 29 items
BALLN JADE .035 7.0 X 40 (BALLOONS) ×3
BALLN MUSTANG 7X60X75 (BALLOONS) ×3
BALLN MUSTANG 7X80X75 (BALLOONS) ×3
BALLOON JADE .035 7.0 X 40 (BALLOONS) ×2 IMPLANT
BALLOON MUSTANG 7X60X75 (BALLOONS) ×2 IMPLANT
BALLOON MUSTANG 7X80X75 (BALLOONS) ×2 IMPLANT
CATH BEACON 5 .035 65 KMP TIP (CATHETERS) ×3 IMPLANT
CATH CROSS OVER TEMPO 5F (CATHETERS) ×3 IMPLANT
CATH OMNI FLUSH 5F 65CM (CATHETERS) ×3 IMPLANT
DEVICE CONTINUOUS FLUSH (MISCELLANEOUS) ×3 IMPLANT
DEVICE TORQUE .025-.038 (MISCELLANEOUS) ×3 IMPLANT
GLIDEWIRE ADV .035X180CM (WIRE) ×3 IMPLANT
GUIDEWIRE ANGLED .035X150CM (WIRE) ×3 IMPLANT
KIT ENCORE 26 ADVANTAGE (KITS) ×3 IMPLANT
KIT MICROPUNCTURE NIT STIFF (SHEATH) ×3 IMPLANT
KIT PV (KITS) ×3 IMPLANT
SHEATH BRITE TIP 6FR 35CM (SHEATH) ×6 IMPLANT
SHEATH PINNACLE 5F 10CM (SHEATH) ×6 IMPLANT
SHEATH PROBE COVER 6X72 (BAG) ×3 IMPLANT
STENT INNOVA 8X20X130 (Permanent Stent) ×3 IMPLANT
STENT INNOVA 8X40X130 (Permanent Stent) ×3 IMPLANT
STENT INNOVA 8X80X130 (Permanent Stent) ×3 IMPLANT
SYR MEDRAD MARK V 150ML (SYRINGE) ×3 IMPLANT
TRANSDUCER W/STOPCOCK (MISCELLANEOUS) ×3 IMPLANT
TRAY PV CATH (CUSTOM PROCEDURE TRAY) ×3 IMPLANT
WIRE BENTSON .035X145CM (WIRE) ×3 IMPLANT
WIRE ROSEN-J .035X180CM (WIRE) ×3 IMPLANT
WIRE ROSEN-J .035X260CM (WIRE) ×6 IMPLANT
WIRE TORQFLEX AUST .018X40CM (WIRE) ×3 IMPLANT

## 2020-01-28 NOTE — Progress Notes (Signed)
Site area: right groin and left Site Prior to Removal:  Level 0  Pressure Applied For 50 MINUTES    Minutes Beginning at 1540  Manual:   Yes.    Patient Status During Pull:  Stable  Post Pull Groin Site:  Level 0  Post Pull Instructions Given:  Yes.    Post Pull Pulses Present:  Yes.    Dressing Applied:  Yes.    Comments:  Bed rest started at 1640 X 4 hr.

## 2020-01-28 NOTE — H&P (Signed)
History and Physical Interval Note:  01/28/2020 11:38 AM  Jason Carson  has presented today for surgery, with the diagnosis of pvd.  The various methods of treatment have been discussed with the patient and family. After consideration of risks, benefits and other options for treatment, the patient has consented to  Procedure(s): ABDOMINAL AORTOGRAM W/LOWER EXTREMITY (N/A) as a surgical intervention.  The patient's history has been reviewed, patient examined, no change in status, stable for surgery.  I have reviewed the patient's chart and labs.  Questions were answered to the patient's satisfaction.    CLI right leg with rest pain.  Cephus Shelling  Patient name: Jason Carson            MRN: [id number]        DOB: December 11, 1950        Sex: male  REASON FOR CONSULT: Right lower extremity paresthesias with pain limiting mobility  HPI: Jason Carson is a 69 y.o. male, with history of tobacco abuse that presents for evaluation of right lower extremity paresthesias and pain limiting his mobility. He states all of this started July 4 of this year when he started having some numbness and tingling in the right thigh. He states he can feel it from his buttock all the way into the toe. This severely limits his mobility and he can only walk about 20 feet without stopping. He states he has numbness in the distal half of his foot into all five toes. Pain in the great toe that wakes him up at night. No active tissue loss at this time. Has been smoking a pack a day for 50 years. He lives alone. No previous lower extremity interventions. States really does not have any other medical problems.      Past Medical History:  Diagnosis Date  . Atherosclerosis   . Benign essential tremor     History reviewed. No pertinent surgical history.       Family History  Problem Relation Age of Onset  . Hypertension Mother   . Hyperlipidemia Mother     SOCIAL HISTORY: Social History         Socioeconomic History  . Marital status: Single    Spouse name: Not on file  . Number of children: Not on file  . Years of education: Not on file  . Highest education level: Not on file  Occupational History  . Not on file  Tobacco Use  . Smoking status: Current Every Day Smoker    Packs/day: 1.25    Types: Cigarettes  . Smokeless tobacco: Never Used  Vaping Use  . Vaping Use: Never used  Substance and Sexual Activity  . Alcohol use: Yes    Alcohol/week: 10.0 - 12.0 standard drinks    Types: 10 - 12 Standard drinks or equivalent per week  . Drug use: Never  . Sexual activity: Yes  Other Topics Concern  . Not on file  Social History Narrative  . Not on file   Social Determinants of Health      Financial Resource Strain:   . Difficulty of Paying Living Expenses: Not on file  Food Insecurity:   . Worried About Programme researcher, broadcasting/film/video in the Last Year: Not on file  . Ran Out of Food in the Last Year: Not on file  Transportation Needs:   . Lack of Transportation (Medical): Not on file  . Lack of Transportation (Non-Medical): Not on file  Physical Activity:   . Days of Exercise per  Week: Not on file  . Minutes of Exercise per Session: Not on file  Stress:   . Feeling of Stress : Not on file  Social Connections:   . Frequency of Communication with Friends and Family: Not on file  . Frequency of Social Gatherings with Friends and Family: Not on file  . Attends Religious Services: Not on file  . Active Member of Clubs or Organizations: Not on file  . Attends Banker Meetings: Not on file  . Marital Status: Not on file  Intimate Partner Violence:   . Fear of Current or Ex-Partner: Not on file  . Emotionally Abused: Not on file  . Physically Abused: Not on file  . Sexually Abused: Not on file    No Known Allergies  Current Outpatient Medications  Medication Sig Dispense Refill  . amLODipine (NORVASC) 5 MG tablet Take 1 tablet (5 mg total) by  mouth daily. 30 tablet 1   No current facility-administered medications for this visit.    REVIEW OF SYSTEMS:  [X]  denotes positive finding, [ ]  denotes negative finding Cardiac  Comments:  Chest pain or chest pressure:    Shortness of breath upon exertion:    Short of breath when lying flat:    Irregular heart rhythm:        Vascular    Pain in calf, thigh, or hip brought on by ambulation: x Right  Pain in feet at night that wakes you up from your sleep:  x Right  Blood clot in your veins:    Leg swelling:         Pulmonary    Oxygen at home:    Productive cough:     Wheezing:         Neurologic    Sudden weakness in arms or legs:     Sudden numbness in arms or legs:     Sudden onset of difficulty speaking or slurred speech:    Temporary loss of vision in one eye:     Problems with dizziness:         Gastrointestinal    Blood in stool:     Vomited blood:         Genitourinary    Burning when urinating:     Blood in urine:        Psychiatric    Major depression:         Hematologic    Bleeding problems:    Problems with blood clotting too easily:        Skin    Rashes or ulcers:        Constitutional    Fever or chills:      PHYSICAL EXAM:    Vitals:   01/20/20 1104  BP: (!) 143/83  Pulse: 86  Resp: 16  Temp: (!) 97.5 F (36.4 C)  SpO2: 95%  Weight: 174 lb (78.9 kg)  Height: 6' (1.829 m)    GENERAL: The patient is a well-nourished male, in no acute distress. The vital signs are documented above. CARDIAC: There is a regular rate and rhythm.  VASCULAR:  Left femoral pulse 2+ palpable Right femoral pulse very weak No palpable pulses in the right foot Left PT 2+ palpable No lower extremity tissue loss PULMONARY: There is good air exchange bilaterally without wheezing or rales. ABDOMEN: Soft and non-tender with normal pitched bowel sounds.   MUSCULOSKELETAL: There are no major deformities or cyanosis. NEUROLOGIC: No focal weakness or paresthesias are detected. SKIN:  There are no ulcers or rashes noted. PSYCHIATRIC: The patient has a normal affect.  DATA:   ABIs from 01/07/2020 are 0.44 on the right monophasic and 1.07 on the left triphasic  Assessment/Plan:  69 year old male presents with critical limb ischemia of the right lower extremity with rest pain. His ABI on the right are severely reduced at 0.44 monophasic at the ankle. He has a very weak right femoral pulse and suspect he has some component of iliac disease or common femoral disease. I subsequently recommended aortogram with lower extremity arteriogram and possible intervention. We talked about if there is an option for endovascular intervention we will try and intervene at the time of arteriogram. Sometimes open surgery and or bypass is required at a later date.  Risk and benefits were discussed in detail including risk of groin access complication or vessel injury etc. Discussed this would be done under moderate sedation at [facility] in the Cath Lab. We will get him scheduled for next week. I discussed the importance of smoking cessation.   Cephus Shelling, MD [facility] [place] Office: [phone]

## 2020-01-28 NOTE — Op Note (Signed)
Patient name: Jason Carson MRN: [id number] DOB: [birth year] Sex: male  01/28/2020 Pre-operative Diagnosis: Right lower extremity critical limb ischemia with rest pain and left lower extremity short distance lifestyle limiting claudication Post-operative diagnosis:  Same Surgeon:  Cephus Shelling, MD Procedure Performed: 1.  Ultrasound-guided access of left common femoral artery 2.  Aortogram including catheter selection of aorta  3.  Ultrasound-guided access of the right common femoral artery 4.  Right external iliac artery angioplasty with stent placement for chronic total occlusion (8 mm x 80 mm self-expanding Innova postdilated with a 7 mm x 80 mm Mustang) 5.  Left external iliac artery angioplasty with stent placement x2 (8 mm x 40 mm self-expanding Innova and 8 mm x 20 mm self-expanding Innova postdilated with a 7 mm Mustang) 6.  Bilateral lower extremity arteriogram with runoff 7.  95 minutes of monitored moderate conscious sedation time  Indications: Patient is a 69 year old male who was seen in clinic recently for critical limb ischemia of the right lower extremity with rest pain with a severely reduced ABI of 0.44 monophasic at the ankle.  He also is now endorsing some claudication symptoms in the left leg.  He presents today for planned arteriogram possible intervention after risk benefits discussed.  Findings:   Aortogram showed one right and three left renal arteries that were widely patent.  The infrarenal aorta was widely patent.  On the right he had a patent common iliac and hypogastric artery but a total occlusion of the right external iliac with reconstitution of the common femoral and then a flush right SFA occlusion and only profunda runoff on the right.  On the left he had a patent common iliac artery and a high-grade 80% calcified stenosis in the left external iliac that we initially had some difficulty crossing retrograde for sheath access.  After evaluating the  images then accessed the right common femoral artery retrograde and was able to cross the right external iliac occlusion.  This was primarily stented with an 8 mm Innova postdilated with a 7 mm Mustang.  He now has a right femoral pulse .  He also endorses claudication symptoms in the left lower extremity and given access issues with the left external iliac lesion I stentede this with a 8 mm x 40 mm self-expanding Innova and post-dilated this with a 7 mm angioplasty balloon.  The stent was too short and had to place a second 8 mm x 20 mm self expanding Innova.  Bilateral lower extremity runoff after iliac stent placement on the right shows flush SFA occlusion with reconstitution of the distal SFA patent above and below-knee popliteal artery and apparent three-vessel runoff.  On the left he has a patent SFA with some calcific disease but no flow-limiting stenosis as well as a patent above below-knee popliteal artery and two-vessel runoff in the peroneal and posterior tibial.   Procedure:  The patient was identified in the holding area and taken to room 8.  The patient was then placed supine on the table and prepped and draped in the usual sterile fashion.  A time out was called.  Ultrasound was used to evaluate the left common femoral artery.  It was patent .  A digital ultrasound image was acquired.  A micropuncture needle was used to access the left common femoral artery under ultrasound guidance.  An 018 wire was advanced without resistance and a micropuncture sheath was placed.  The 018 wire was removed and a benson wire was placed.  The micropuncture sheath was exchanged for a 5 french sheath.  An omniflush catheter was advanced over the wire to the level of L-1.  An abdominal angiogram was obtained.  Evaluating the images appearred to have a right external iliac occlusion.  I initially tried to probe this antegrade after switching for a crossover catheter from left groin access but my wire kept wanting to  go down the hypogastric and I did not have much of a stump for support.  I then evaluated the right common femoral with ultrasound it was patent and image was saved.  This was accessed with a micro access needle under ultrasound guidance, placed a microwire and then a micro sheath.  I then used a Glidewire advantage and ultimately was able to get partially cross the right external iliac occlusion in order to get a short 5 French sheath in the right groin.  I then used a KMP for additional support and was able to cross the right external iliac back into the true lumen.  I confirmed hand-injection I was in the true lumen in the right common iliac.  He was given 100 units per kilogram heparin and ACT's were checked to maintain greater than 250.  I then measured the healthy external iliac on the contralateral side to select the size of my stent.  I then placed a 6 French bright tip sheath in the right groin and pulled this all the way back into the common femoral and then over my wire I was able to place a 8 mm x 80 mm self-expanding Innova in the right external iliac postdilated this with a 7 mm balloon.  Patient then had a palpable right femoral pulse with excellent inline flow down the right lower extremity.  I then elected to stent the left external iliac given he does endorse worsening claudication symptoms in the left leg and I had some issues getting his sheath in on the left.  Exchanged for 6 French bright tip in left common femoral artery. Subsequently selected an 8 mm x 40 mm self expanding Innova for a focal greater than 80% calcified stenosis in the external iliac on the left.  This was postdilated with a 7 mm balloon and initially appeared to look pretty good.  I then put Omni Flush catheter back up evaluate iliac stents that showed overall good wall apposition with no overt residual stenosis.  I then shot bilateral lower extremity runoff after pulling the sheaths back in the common femorals.  As noted  above he has a flush SFA occlusion on the right and ultimately inline flow down the left lower extremity.  Flow was more sluggish on the left and I was concerned about this and looking back at the images there appeared to be a external iliac lesion just distal to her stent that we did not appropriately cover.  I then selected a second 8 mm x 20 mm self expanding Innova that was deployed just distal to her existing stent in the left external iliac postdilated with a 7 mm balloon.  Appear to have much more brisk flow down the left lower extremity.  That point time sheaths were secured and wires and catheters removed and he will be taken to holding to have his sheaths removed.  Plan: Patient will be loaded on Plavix.  I will arrange follow-up in 1 month with iliac duplex and ABIs.  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  Cephus Shelling, MD [facility] [place] Office: [phone]

## 2020-01-28 NOTE — Discharge Instructions (Signed)
Femoral Site Care This sheet gives you information about how to care for yourself after your procedure. Your health care provider may also give you more specific instructions. If you have problems or questions, contact your health care provider. What can I expect after the procedure? After the procedure, it is common to have:  Bruising that usually fades within 1-2 weeks.  Tenderness at the site. Follow these instructions at home: Wound care  Follow instructions from your health care provider about how to take care of your insertion site. Make sure you: ? Wash your hands with soap and water before you change your bandage (dressing). If soap and water are not available, use hand sanitizer. ? Change your dressing as told by your health care provider. ? Leave stitches (sutures), skin glue, or adhesive strips in place. These skin closures may need to stay in place for 2 weeks or longer. If adhesive strip edges start to loosen and curl up, you may trim the loose edges. Do not remove adhesive strips completely unless your health care provider tells you to do that.  Do not take baths, swim, or use a hot tub until your health care provider approves.  You may shower 24-48 hours after the procedure or as told by your health care provider. ? Gently wash the site with plain soap and water. ? Pat the area dry with a clean towel. ? Do not rub the site. This may cause bleeding.  Do not apply powder or lotion to the site. Keep the site clean and dry.  Check your femoral site every day for signs of infection. Check for: ? Redness, swelling, or pain. ? Fluid or blood. ? Warmth. ? Pus or a bad smell. Activity  For the first 2-3 days after your procedure, or as long as directed: ? Avoid climbing stairs as much as possible. ? Do not squat.  Do not lift anything that is heavier than 10 lb (4.5 kg), or the limit that you are told, until your health care provider says that it is safe.  Rest as  directed. ? Avoid sitting for a long time without moving. Get up to take short walks every 1-2 hours.  Do not drive for 24 hours if you were given a medicine to help you relax (sedative). General instructions  Take over-the-counter and prescription medicines only as told by your health care provider.  Keep all follow-up visits as told by your health care provider. This is important. Contact a health care provider if you have:  A fever or chills.  You have redness, swelling, or pain around your insertion site. Get help right away if:  The catheter insertion area swells very fast.  You pass out.  You suddenly start to sweat or your skin gets clammy.  The catheter insertion area is bleeding, and the bleeding does not stop when you hold steady pressure on the area.  The area near or just beyond the catheter insertion site becomes pale, cool, tingly, or numb. These symptoms may represent a serious problem that is an emergency. Do not wait to see if the symptoms will go away. Get medical help right away. Call your local emergency services (911 in the [place] Do not drive yourself to the hospital. Summary  After the procedure, it is common to have bruising that usually fades within 1-2 weeks.  Check your femoral site every day for signs of infection.  Do not lift anything that is heavier than 10 lb (4.5 kg), or the   limit that you are told, until your health care provider says that it is safe. This information is not intended to replace advice given to you by your health care provider. Make sure you discuss any questions you have with your health care provider. Document Revised: 05/28/2017 Document Reviewed: 05/28/2017 Elsevier Patient Education  2020 [employer].    Start aspirin and Plavix daily.  Aspirin can be 81 mg over-the-counter.  Plavix prescription sent to pharmacy.  Also recommend low-dose statin for cholesterol which has been sent to your pharmacy.

## 2020-01-29 ENCOUNTER — Encounter (HOSPITAL_COMMUNITY): Payer: Self-pay | Admitting: Vascular Surgery

## 2020-02-08 DIAGNOSIS — I10 Essential (primary) hypertension: Secondary | ICD-10-CM | POA: Insufficient documentation

## 2020-02-08 NOTE — Progress Notes (Signed)
Subjective:    CC: HTN follow up  HPI: Pleasant 69 year old male presenting for HTN follow up. Recently underwent an abdominal aortogram with bilateral stents placed to return blood flow to the lower extremities.  Has been doing very well since his procedure and reports his right leg symptoms are 97% improved.  He is able to walk and do normal activities again.  Taking amlodipine 5 mg daily usually around 10 AM each evening each day.  Presents with a printout of his averages.  Blood pressure in the mornings continue to be elevated with improvements in the afternoon and then beautiful readings in the evening.  He has been paying attention to sodium content in his diet and trying to avoid high sodium/processed foods.  Not currently exercising but plans to contact his vascular surgery office to clarify his current restrictions today.  Denies chest pain, palpitations, headaches, dizziness, vision changes.  Mild shortness of breath with exercise, not severe enough to prompt him to stop the activity.  Mild puffiness of bilateral feet and ankles.  I reviewed the past medical history, family history, social history, surgical history, and allergies today and no changes were needed.  Please see the problem list section below in epic for further details.  Past Medical History: Past Medical History:  Diagnosis Date  . Atherosclerosis   . Benign essential tremor    Past Surgical History: Past Surgical History:  Procedure Laterality Date  . ABDOMINAL AORTOGRAM W/LOWER EXTREMITY N/A 01/28/2020   Procedure: ABDOMINAL AORTOGRAM W/LOWER EXTREMITY;  Surgeon: Cephus Shelling, MD;  Location: [facility];  Service: Cardiovascular;  Laterality: N/A;  . PERIPHERAL VASCULAR INTERVENTION Bilateral 01/28/2020   Procedure: PERIPHERAL VASCULAR INTERVENTION;  Surgeon: Cephus Shelling, MD;  Location: [facility];  Service: Cardiovascular;  Laterality: Bilateral;  external iliac stents   Social  History: Social History   Socioeconomic History  . Marital status: Single    Spouse name: Not on file  . Number of children: Not on file  . Years of education: Not on file  . Highest education level: Not on file  Occupational History  . Not on file  Tobacco Use  . Smoking status: Current Every Day Smoker    Packs/day: 1.25    Types: Cigarettes  . Smokeless tobacco: Never Used  Vaping Use  . Vaping Use: Never used  Substance and Sexual Activity  . Alcohol use: Yes    Alcohol/week: 10.0 - 12.0 standard drinks    Types: 10 - 12 Standard drinks or equivalent per week  . Drug use: Never  . Sexual activity: Yes  Other Topics Concern  . Not on file  Social History Narrative  . Not on file   Social Determinants of Health   Financial Resource Strain:   . Difficulty of Paying Living Expenses: Not on file  Food Insecurity:   . Worried About Programme researcher, broadcasting/film/video in the Last Year: Not on file  . Ran Out of Food in the Last Year: Not on file  Transportation Needs:   . Lack of Transportation (Medical): Not on file  . Lack of Transportation (Non-Medical): Not on file  Physical Activity:   . Days of Exercise per Week: Not on file  . Minutes of Exercise per Session: Not on file  Stress:   . Feeling of Stress : Not on file  Social Connections:   . Frequency of Communication with Friends and Family: Not on file  . Frequency of Social Gatherings with  Friends and Family: Not on file  . Attends Religious Services: Not on file  . Active Member of Clubs or Organizations: Not on file  . Attends Banker Meetings: Not on file  . Marital Status: Not on file   Family History: Family History  Problem Relation Age of Onset  . Hypertension Mother   . Hyperlipidemia Mother    Allergies: No Known Allergies Medications: See med rec.  Review of Systems: See HPI for pertinent positives and negatives.   Objective:    General: Well Developed, well nourished, and in no acute  distress.  Neuro: Alert and oriented x3.  HEENT: Normocephalic, atraumatic.  Skin: Warm and dry. Cardiac: Regular rate and rhythm, no murmurs rubs or gallops, no lower extremity edema.  Respiratory: Clear to auscultation bilaterally. Not using accessory muscles, speaking in full sentences.   Impression and Recommendations:    1. Essential hypertension Continue amlodipine 5 mg daily but shifted dosing to nighttime dosing to see if this will make a difference in his morning blood pressures.  I will touch base with him via MyChart in approximately 2 weeks to see if nighttime dosing has made a difference.  If not, we may need to add in a small morning dose of a different agent.  Continue to limit sodium.  Exercise as permitted by vascular surgery. - amLODipine (NORVASC) 5 MG tablet; Take 1 tablet (5 mg total) by mouth daily.  Dispense: 90 tablet; Refill: 1  2. Elevated LDL cholesterol level Continue atorvastatin 10 mg daily.  3. Need for influenza vaccination Flu vaccine given in office. - Flu Vaccine QUAD High Dose(Fluad)  Return for HTN follow up in 3-4 months. ___________________________________________ Thayer Ohm, DNP, APRN, FNP-BC Primary Care and Sports Medicine [facility]

## 2020-02-09 ENCOUNTER — Encounter: Payer: Self-pay | Admitting: Medical-Surgical

## 2020-02-09 ENCOUNTER — Other Ambulatory Visit: Payer: Self-pay

## 2020-02-09 ENCOUNTER — Ambulatory Visit (INDEPENDENT_AMBULATORY_CARE_PROVIDER_SITE_OTHER): Payer: Medicare Other | Admitting: Medical-Surgical

## 2020-02-09 VITALS — BP 172/91 | HR 82 | Temp 98.4°F | Ht 72.0 in | Wt 175.1 lb

## 2020-02-09 DIAGNOSIS — I10 Essential (primary) hypertension: Secondary | ICD-10-CM | POA: Diagnosis not present

## 2020-02-09 DIAGNOSIS — E7800 Pure hypercholesterolemia, unspecified: Secondary | ICD-10-CM | POA: Diagnosis not present

## 2020-02-09 DIAGNOSIS — Z23 Encounter for immunization: Secondary | ICD-10-CM | POA: Diagnosis not present

## 2020-02-09 MED ORDER — AMLODIPINE BESYLATE 5 MG PO TABS: 5.0000 mg | ORAL_TABLET | Freq: Every day | ORAL | 1 refills | Status: DC

## 2020-02-09 NOTE — Patient Instructions (Signed)

## 2020-02-10 ENCOUNTER — Telehealth: Payer: Self-pay | Admitting: *Deleted

## 2020-02-10 NOTE — Telephone Encounter (Signed)
Patient called states he has a small marble size note at groin incision site denies any pain or swelling at site. Instructed patient to call us back if any increase in size or any pain and swelling noted. Patient verbalized understanding. Patient also had question about activity spoke with Dr Chestine Spore he states activity as tolerated is ok. Patient has post op appt already scheduled.

## 2020-02-17 ENCOUNTER — Encounter: Payer: Medicare Other | Admitting: Vascular Surgery

## 2020-02-20 ENCOUNTER — Other Ambulatory Visit: Payer: Self-pay

## 2020-02-20 DIAGNOSIS — I70229 Atherosclerosis of native arteries of extremities with rest pain, unspecified extremity: Secondary | ICD-10-CM

## 2020-02-23 ENCOUNTER — Encounter: Payer: Self-pay | Admitting: Medical-Surgical

## 2020-02-25 MED ORDER — HYDROCHLOROTHIAZIDE 12.5 MG PO TABS: 12.5000 mg | ORAL_TABLET | Freq: Every day | ORAL | 3 refills | Status: DC

## 2020-03-05 ENCOUNTER — Encounter (HOSPITAL_COMMUNITY): Payer: Medicare Other

## 2020-03-05 ENCOUNTER — Encounter: Payer: Medicare Other | Admitting: Vascular Surgery

## 2020-03-09 ENCOUNTER — Encounter: Payer: Self-pay | Admitting: Vascular Surgery

## 2020-03-09 ENCOUNTER — Other Ambulatory Visit: Payer: Self-pay

## 2020-03-09 ENCOUNTER — Ambulatory Visit (HOSPITAL_COMMUNITY)
Admission: RE | Admit: 2020-03-09 | Discharge: 2020-03-09 | Disposition: A | Discharge status: Home / Self Care | Payer: Medicare Other | Source: Ambulatory Visit | Attending: Vascular Surgery | Admitting: Vascular Surgery

## 2020-03-09 ENCOUNTER — Ambulatory Visit (INDEPENDENT_AMBULATORY_CARE_PROVIDER_SITE_OTHER): Payer: Medicare Other | Admitting: Vascular Surgery

## 2020-03-09 ENCOUNTER — Ambulatory Visit (INDEPENDENT_AMBULATORY_CARE_PROVIDER_SITE_OTHER)
Admission: RE | Admit: 2020-03-09 | Discharge: 2020-03-09 | Disposition: A | Discharge status: Home / Self Care | Payer: Medicare Other | Source: Ambulatory Visit | Attending: Vascular Surgery | Admitting: Vascular Surgery

## 2020-03-09 VITALS — BP 161/84 | HR 84 | Temp 98.1°F | Resp 18 | Ht 72.0 in | Wt 175.0 lb

## 2020-03-09 DIAGNOSIS — I70229 Atherosclerosis of native arteries of extremities with rest pain, unspecified extremity: Secondary | ICD-10-CM | POA: Diagnosis present

## 2020-03-09 NOTE — Progress Notes (Signed)
Patient name: Jason Carson MRN: [id number] DOB: 01-31-1951 Sex: male  REASON FOR VISIT: Follow-up after bilateral external iliac artery angioplasty with stent placement  HPI: Jason Carson is a 69 y.o. male with history of peripheral arterial disease and tobacco abuse that presents for follow-up after recent iliac intervention.  Initially he had critical limb ischemia of the right foot with rest pain and an ABI 0.44.  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  Also placed two stents in the left external iliac artery for high grade stenosis as it related to left leg claudication.  He seems very satisfied today and states his right leg feels so much better.  He only complains of some mild numbness in the toe that is not bothersome.  Still smoking a pack a day.  Taking aspirin Plavix.  Past Medical History:  Diagnosis Date  . Atherosclerosis   . Benign essential tremor     Past Surgical History:  Procedure Laterality Date  . ABDOMINAL AORTOGRAM W/LOWER EXTREMITY N/A 01/28/2020   Procedure: ABDOMINAL AORTOGRAM W/LOWER EXTREMITY;  Surgeon: Cephus Shelling, MD;  Location: [facility];  Service: Cardiovascular;  Laterality: N/A;  . PERIPHERAL VASCULAR INTERVENTION Bilateral 01/28/2020   Procedure: PERIPHERAL VASCULAR INTERVENTION;  Surgeon: Cephus Shelling, MD;  Location: [facility];  Service: Cardiovascular;  Laterality: Bilateral;  external iliac stents    Family History  Problem Relation Age of Onset  . Hypertension Mother   . Hyperlipidemia Mother     SOCIAL HISTORY: Social History   Tobacco Use  . Smoking status: Current Every Day Smoker    Packs/day: 1.25    Types: Cigarettes  . Smokeless tobacco: Never Used  Substance Use Topics  . Alcohol use: Yes    Alcohol/week: 10.0 - 12.0 standard drinks    Types: 10 - 12 Standard drinks or equivalent per week    No Known  Allergies  Current Outpatient Medications  Medication Sig Dispense Refill  . amLODipine (NORVASC) 5 MG tablet Take 1 tablet (5 mg total) by mouth daily. 90 tablet 1  . aspirin EC 81 MG tablet Take 1 tablet (81 mg total) by mouth daily. Swallow whole. 30 tablet 11  . atorvastatin (LIPITOR) 10 MG tablet Take 1 tablet (10 mg total) by mouth daily. 30 tablet 11  . chlorhexidine (PERIDEX) 0.12 % solution Use as directed 15 mLs in the mouth or throat at bedtime.    . clopidogrel (PLAVIX) 75 MG tablet Take 1 tablet (75 mg total) by mouth daily. 30 tablet 11  . hydrochlorothiazide (HYDRODIURIL) 12.5 MG tablet Take 1 tablet (12.5 mg total) by mouth daily. 30 tablet 3  . Multiple Vitamin (MULTIVITAMIN WITH MINERALS) TABS tablet Take 1 tablet by mouth daily.    Marland Kitchen PHENYLEPHRINE HCL NA Place 1 spray into the nose daily as needed (congestion).     No current facility-administered medications for this visit.    REVIEW OF SYSTEMS:  [X]  denotes positive finding, [ ]  denotes negative finding Cardiac  Comments:  Chest pain or chest pressure:    Shortness of breath upon exertion:    Short of breath when lying flat:    Irregular heart rhythm:        Vascular    Pain in calf, thigh, or hip brought on by ambulation:    Pain in feet at night that wakes you up from your sleep:     Blood  clot in your veins:    Leg swelling:         Pulmonary    Oxygen at home:    Productive cough:     Wheezing:         Neurologic    Sudden weakness in arms or legs:     Sudden numbness in arms or legs:     Sudden onset of difficulty speaking or slurred speech:    Temporary loss of vision in one eye:     Problems with dizziness:         Gastrointestinal    Blood in stool:     Vomited blood:         Genitourinary    Burning when urinating:     Blood in urine:        Psychiatric    Major depression:         Hematologic    Bleeding problems:    Problems with blood clotting too easily:        Skin    Rashes  or ulcers:        Constitutional    Fever or chills:      PHYSICAL EXAM: Vitals:   03/09/20 0845  BP: (!) 161/84  Pulse: 84  Resp: 18  Temp: 98.1 F (36.7 C)  TempSrc: Temporal  SpO2: 95%  Weight: 175 lb (79.4 kg)  Height: 6' (1.829 m)    GENERAL: The patient is a well-nourished male, in no acute distress. The vital signs are documented above. CARDIAC: There is a regular rate and rhythm.  VASCULAR:  Palpable femoral pulses both groins, no hematoma Left PT palpable No palpable right pedal pulses  DATA:   Bilateral external iliac artery stents are widely patent on arterial duplex today  ABIs on the right are improved from 0.44 to 0.69 and normal on the left at 1.01  Assessment/Plan:  69 year old male status post bilateral external iliac artery intervention for critical limb ischemia with rest pain in the right lower extremity and claudication in the left leg.  Ultimately has had complete resolution of symptoms and is very satisfied with the results.  He has easily palpable femoral pulses.  His bilateral external iliac stents are widely patent on duplex today.  He does have a right SFA occlusion but given symptom resolution with treatment of his right external iliac chronic total occlusion there is no role for further intervention at this time and would ultimately require a bypass.  Discussed that he stay on aspirin Plavix for dual antiplatelet therapy.  Discussed the importance of smoking cessation.  I will have him follow-up in 6 months with aortoiliac duplex and ABIs for ongoing surveillance.  He knows to call if there is a problem sooner.   Cephus Shelling, MD [facility] Office: [phone]

## 2020-03-15 ENCOUNTER — Other Ambulatory Visit: Payer: Self-pay | Admitting: *Deleted

## 2020-03-15 DIAGNOSIS — I70229 Atherosclerosis of native arteries of extremities with rest pain, unspecified extremity: Secondary | ICD-10-CM

## 2020-03-25 ENCOUNTER — Encounter: Payer: Self-pay | Admitting: Medical-Surgical

## 2020-03-29 ENCOUNTER — Encounter: Payer: Self-pay | Admitting: Medical-Surgical

## 2020-03-29 MED ORDER — DOXAZOSIN MESYLATE 1 MG PO TABS: 1.0000 mg | ORAL_TABLET | Freq: Every day | ORAL | 1 refills | Status: DC

## 2020-04-12 ENCOUNTER — Other Ambulatory Visit: Payer: Self-pay | Admitting: Medical-Surgical

## 2020-04-12 MED ORDER — DOXAZOSIN MESYLATE 2 MG PO TABS: 2.0000 mg | ORAL_TABLET | Freq: Every day | ORAL | 1 refills | Status: DC

## 2020-05-10 ENCOUNTER — Encounter: Payer: Self-pay | Admitting: Medical-Surgical

## 2020-05-10 ENCOUNTER — Other Ambulatory Visit: Payer: Self-pay

## 2020-05-10 ENCOUNTER — Ambulatory Visit (INDEPENDENT_AMBULATORY_CARE_PROVIDER_SITE_OTHER): Payer: Medicare Other | Admitting: Medical-Surgical

## 2020-05-10 VITALS — BP 164/88 | HR 64 | Temp 98.6°F | Ht 72.0 in | Wt 183.6 lb

## 2020-05-10 DIAGNOSIS — I10 Essential (primary) hypertension: Secondary | ICD-10-CM

## 2020-05-10 NOTE — Patient Instructions (Signed)
Take 100m of Doxazosin (Cardura) at bedtime.  Be on the lookout for home sleep study kit to come to your address!

## 2020-05-10 NOTE — Progress Notes (Signed)
Subjective:    CC: HTN, PVD follow up  HPI: Pleasant 69 year old male presenting for follow up on PVD and HTN.   HTN- Taking Amlodipine 5mg  daily in the morning and Cardura 1mg  nightly. Has not started the 2mg  dose of Cardura yet. BP still elevated in the mornings and normal in the afternoons and evenings. Smoking approximately 1 ppd of cigarettes. Does snore from previous reports but has not been witnessed having apnea. No previous sleep study or CPAP use. Drinks 0-3 alcoholic beverages most nights, mix of beer, wine, and liquor.   PVD- doing very well post-stent placement. Has returned to previous activity levels and admits that he doesn't think about his leg problem most days.   I reviewed the past medical history, family history, social history, surgical history, and allergies today and no changes were needed.  Please see the problem list section below in epic for further details.  Past Medical History: Past Medical History:  Diagnosis Date  . Atherosclerosis   . Benign essential tremor    Past Surgical History: Past Surgical History:  Procedure Laterality Date  . ABDOMINAL AORTOGRAM W/LOWER EXTREMITY N/A 01/28/2020   Procedure: ABDOMINAL AORTOGRAM W/LOWER EXTREMITY;  Surgeon: , MD;  Location: [facility];  Service: Cardiovascular;  Laterality: N/A;  . PERIPHERAL VASCULAR INTERVENTION Bilateral 01/28/2020   Procedure: PERIPHERAL VASCULAR INTERVENTION;  Surgeon: 03/29/2020, MD;  Location: [facility];  Service: Cardiovascular;  Laterality: Bilateral;  external iliac stents   Social History: Social History   Socioeconomic History  . Marital status: Single    Spouse name: Not on file  . Number of children: Not on file  . Years of education: Not on file  . Highest education level: Not on file  Occupational History  . Not on file  Tobacco Use  . Smoking status: Current Every Day Smoker    Packs/day: 1.25    Types: Cigarettes  .  Smokeless tobacco: Never Used  Vaping Use  . Vaping Use: Never used  Substance and Sexual Activity  . Alcohol use: Yes    Alcohol/week: 10.0 - 12.0 standard drinks    Types: 10 - 12 Standard drinks or equivalent per week  . Drug use: Never  . Sexual activity: Yes  Other Topics Concern  . Not on file  Social History Narrative  . Not on file   Social Determinants of Health   Financial Resource Strain: Not on file  Food Insecurity: Not on file  Transportation Needs: Not on file  Physical Activity: Not on file  Stress: Not on file  Social Connections: Not on file   Family History: Family History  Problem Relation Age of Onset  . Hypertension Mother   . Hyperlipidemia Mother    Allergies: No Known Allergies Medications: See med rec.  Review of Systems: See HPI for pertinent positives and negatives.   Objective:    General: Well Developed, well nourished, and in no acute distress.  Neuro: Alert and oriented x3.  HEENT: Normocephalic, atraumatic.  Skin: Warm and dry. Cardiac: Regular rate and rhythm, no murmurs rubs or gallops, no lower extremity edema.  Respiratory: Clear to auscultation bilaterally. Not using accessory muscles, speaking in full sentences.  Impression and Recommendations:    1. Essential hypertension Continue Amlodipine 5mg  daily. Increase Cardura to 2mg  nightly. Home sleep study. Continue monitoring BP at home. Recommend smoking cessation. Discussed alcohol intake recommendations. - Home sleep test  Return for HTN follow up via MyChart in  2 weeks. ___________________________________________ Thayer Ohm, DNP, APRN, FNP-BC Primary Care and Sports Medicine [facility]

## 2020-05-20 MED ORDER — DOXAZOSIN MESYLATE 8 MG PO TABS: 8.0000 mg | ORAL_TABLET | Freq: Every day | ORAL | 1 refills | Status: DC

## 2020-05-24 ENCOUNTER — Telehealth: Payer: Medicare Other | Admitting: Medical-Surgical

## 2020-06-14 MED ORDER — AMLODIPINE BESYLATE 2.5 MG PO TABS: 2.5000 mg | ORAL_TABLET | Freq: Every day | ORAL | 1 refills | Status: DC

## 2020-06-14 NOTE — Addendum Note (Signed)
Addended byChristen Butter on: 06/14/2020 09:10 AM   Modules accepted: Orders

## 2020-07-07 ENCOUNTER — Telehealth: Payer: Self-pay

## 2020-07-07 NOTE — Telephone Encounter (Signed)
Patient has fasting study scheduled for 5/17 - called to report taking Gas X "blows him up like a balloon" and the last time they did the study they were unable to visualize well. Discussed with lab personnel and they said that patient could skip the Gas X, but no eating after midnight, no chewing gum or smoking. Patient verbalizes understanding.

## 2020-08-06 ENCOUNTER — Encounter: Payer: Self-pay | Admitting: Medical-Surgical

## 2020-08-09 ENCOUNTER — Encounter: Payer: Self-pay | Admitting: Medical-Surgical

## 2020-08-09 ENCOUNTER — Other Ambulatory Visit: Payer: Self-pay

## 2020-08-09 ENCOUNTER — Ambulatory Visit (INDEPENDENT_AMBULATORY_CARE_PROVIDER_SITE_OTHER): Payer: Medicare Other | Admitting: Medical-Surgical

## 2020-08-09 VITALS — BP 149/77 | HR 81 | Temp 99.1°F | Ht 72.0 in | Wt 185.8 lb

## 2020-08-09 DIAGNOSIS — G4733 Obstructive sleep apnea (adult) (pediatric): Secondary | ICD-10-CM | POA: Diagnosis not present

## 2020-08-09 MED ORDER — AMBULATORY NON FORMULARY MEDICATION: 0 refills | Status: DC

## 2020-08-09 NOTE — Progress Notes (Signed)
Subjective:    CC: sleep study review  HPI: Pleasant 70 year old male presenting to discuss the results of his sleep study. He has been able to review his sleep study report and investigate the findings. Notes that he does have trouble with nasal/sinus congestion whenever he lies horizontally but doesn't have difficulty with issues during the day. Is still smoking and notes about 15lb weight gain over the past year. Reports that he does not feel bad in the mornings and wakes feeling rested.   I reviewed the past medical history, family history, social history, surgical history, and allergies today and no changes were needed.  Please see the problem list section below in epic for further details.  Past Medical History: Past Medical History:  Diagnosis Date  . Atherosclerosis   . Benign essential tremor    Past Surgical History: Past Surgical History:  Procedure Laterality Date  . ABDOMINAL AORTOGRAM W/LOWER EXTREMITY N/A 01/28/2020   Procedure: ABDOMINAL AORTOGRAM W/LOWER EXTREMITY;  Surgeon: Cephus Shelling, MD;  Location: [facility];  Service: Cardiovascular;  Laterality: N/A;  . PERIPHERAL VASCULAR INTERVENTION Bilateral 01/28/2020   Procedure: PERIPHERAL VASCULAR INTERVENTION;  Surgeon: Cephus Shelling, MD;  Location: [facility];  Service: Cardiovascular;  Laterality: Bilateral;  external iliac stents   Social History: Social History   Socioeconomic History  . Marital status: Single    Spouse name: Not on file  . Number of children: Not on file  . Years of education: Not on file  . Highest education level: Not on file  Occupational History  . Not on file  Tobacco Use  . Smoking status: Current Every Day Smoker    Packs/day: 1.25    Types: Cigarettes  . Smokeless tobacco: Never Used  Vaping Use  . Vaping Use: Never used  Substance and Sexual Activity  . Alcohol use: Yes    Alcohol/week: 10.0 - 12.0 standard drinks    Types: 10 - 12 Standard drinks  or equivalent per week  . Drug use: Never  . Sexual activity: Yes  Other Topics Concern  . Not on file  Social History Narrative  . Not on file   Social Determinants of Health   Financial Resource Strain: Not on file  Food Insecurity: Not on file  Transportation Needs: Not on file  Physical Activity: Not on file  Stress: Not on file  Social Connections: Not on file   Family History: Family History  Problem Relation Age of Onset  . Hypertension Mother   . Hyperlipidemia Mother    Allergies: No Known Allergies Medications: See med rec.  Review of Systems: See HPI for pertinent positives and negatives.   Objective:    General: Well Developed, well nourished, and in no acute distress.  Neuro: Alert and oriented x3.  HEENT: Normocephalic, atraumatic.  Skin: Warm and dry. Cardiac: Regular rate and rhythm.  Respiratory: Not using accessory muscles, speaking in full sentences.  Impression and Recommendations:    1. 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 Discussed likely relationship to refractory blood pressure and morning surge BP. CPAP ordered, faxed to [facility]. Would like a download of readings after 2 weeks of use to evaluate settings and response. Discussed a referral to ENT to evaluate nasal/sinus congestion and potential causes of such severe sleep apnea. He would like to see how it goes with the CPAP before doing a referral. Ok to take Claritin to help with allergies/nasal congestion.   Return if symptoms  worsen or fail to improve. ___________________________________________ Thayer Ohm, DNP, APRN, FNP-BC Primary Care and Sports Medicine [facility]

## 2020-10-12 ENCOUNTER — Ambulatory Visit: Payer: Medicare Other | Admitting: Vascular Surgery

## 2020-10-12 ENCOUNTER — Encounter (HOSPITAL_COMMUNITY): Payer: Medicare Other

## 2020-10-25 ENCOUNTER — Other Ambulatory Visit: Payer: Self-pay | Admitting: Medical-Surgical

## 2020-10-26 ENCOUNTER — Encounter: Payer: Self-pay | Admitting: Medical-Surgical

## 2020-10-26 ENCOUNTER — Other Ambulatory Visit: Payer: Self-pay | Admitting: Medical-Surgical

## 2020-10-26 MED ORDER — AMBULATORY NON FORMULARY MEDICATION: 0 refills | Status: DC

## 2020-10-26 NOTE — Progress Notes (Signed)
Prescription for CPAP reprinted. Sending to [facility] since patient has not heard from [facility] since mid-March.

## 2020-11-02 ENCOUNTER — Encounter: Payer: Self-pay | Admitting: Vascular Surgery

## 2020-11-02 ENCOUNTER — Other Ambulatory Visit: Payer: Self-pay

## 2020-11-02 ENCOUNTER — Ambulatory Visit (HOSPITAL_COMMUNITY)
Admission: RE | Admit: 2020-11-02 | Discharge: 2020-11-02 | Disposition: A | Discharge status: Home / Self Care | Payer: Medicare Other | Source: Ambulatory Visit | Attending: Vascular Surgery | Admitting: Vascular Surgery

## 2020-11-02 ENCOUNTER — Ambulatory Visit (INDEPENDENT_AMBULATORY_CARE_PROVIDER_SITE_OTHER): Payer: Medicare Other | Admitting: Vascular Surgery

## 2020-11-02 ENCOUNTER — Ambulatory Visit (INDEPENDENT_AMBULATORY_CARE_PROVIDER_SITE_OTHER)
Admission: RE | Admit: 2020-11-02 | Discharge: 2020-11-02 | Disposition: A | Discharge status: Home / Self Care | Payer: Medicare Other | Source: Ambulatory Visit | Attending: Vascular Surgery | Admitting: Vascular Surgery

## 2020-11-02 VITALS — BP 172/80 | HR 72 | Temp 98.1°F | Resp 18 | Ht 72.0 in | Wt 183.0 lb

## 2020-11-02 DIAGNOSIS — I70229 Atherosclerosis of native arteries of extremities with rest pain, unspecified extremity: Secondary | ICD-10-CM | POA: Diagnosis present

## 2020-11-02 NOTE — Progress Notes (Signed)
Patient name: Newton Frutiger MRN: [id number] DOB: Jun 30, 1950 Sex: male  REASON FOR VISIT: 6 month follow-up after bilateral external iliac artery angioplasty with stent placement  HPI: Devian Bartolomei is a 70 y.o. male with history of peripheral arterial disease and tobacco abuse that presents for follow-up after previous iliac intervention.  Initially he had critical limb ischemia of the right foot with rest pain and an ABI 0.44.  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  Also placed two stents in the left external iliac artery for high grade stenosis as it related to left leg claudication.    He has no new concerns today.  He is taking dual antiplatelet therapy.  He does feel that his legs are doing great with no rest pain or claudication.  He is smoking 15 cigarettes a day.  Past Medical History:  Diagnosis Date  . Atherosclerosis   . Benign essential tremor     Past Surgical History:  Procedure Laterality Date  . ABDOMINAL AORTOGRAM W/LOWER EXTREMITY N/A 01/28/2020   Procedure: ABDOMINAL AORTOGRAM W/LOWER EXTREMITY;  Surgeon: Cephus Shelling, MD;  Location: [facility];  Service: Cardiovascular;  Laterality: N/A;  . PERIPHERAL VASCULAR INTERVENTION Bilateral 01/28/2020   Procedure: PERIPHERAL VASCULAR INTERVENTION;  Surgeon: Cephus Shelling, MD;  Location: [facility];  Service: Cardiovascular;  Laterality: Bilateral;  external iliac stents    Family History  Problem Relation Age of Onset  . Hypertension Mother   . Hyperlipidemia Mother     SOCIAL HISTORY: Social History   Tobacco Use  . Smoking status: Current Every Day Smoker    Packs/day: 1.25    Types: Cigarettes  . Smokeless tobacco: Never Used  Substance Use Topics  . Alcohol use: Yes    Alcohol/week: 10.0 - 12.0 standard drinks    Types: 10 - 12 Standard drinks or equivalent per week    No Known  Allergies  Current Outpatient Medications  Medication Sig Dispense Refill  . AMBULATORY NON FORMULARY MEDICATION Continuous positive airway pressure (CPAP) machine set on AutoPAP (4-20 cmH2O), with all supplemental supplies as needed. 1 each 0  . amLODipine (NORVASC) 2.5 MG tablet Take 1 tablet (2.5 mg total) by mouth daily. 90 tablet 1  . aspirin EC 81 MG tablet Take 1 tablet (81 mg total) by mouth daily. Swallow whole. 30 tablet 11  . atorvastatin (LIPITOR) 10 MG tablet Take 1 tablet (10 mg total) by mouth daily. 30 tablet 11  . chlorhexidine (PERIDEX) 0.12 % solution Use as directed 15 mLs in the mouth or throat at bedtime.    . clopidogrel (PLAVIX) 75 MG tablet Take 1 tablet (75 mg total) by mouth daily. 30 tablet 11  . doxazosin (CARDURA) 8 MG tablet TAKE 1 TABLET BY MOUTH AT BEDTIME 90 tablet 0  . Multiple Vitamin (MULTIVITAMIN WITH MINERALS) TABS tablet Take 1 tablet by mouth daily.    Marland Kitchen PHENYLEPHRINE HCL NA Place 1 spray into the nose daily as needed (congestion).     No current facility-administered medications for this visit.    REVIEW OF SYSTEMS:  [X]  denotes positive finding, [ ]  denotes negative finding Cardiac  Comments:  Chest pain or chest pressure:    Shortness of breath upon exertion:    Short of breath when lying flat:    Irregular heart rhythm:        Vascular    Pain in calf, thigh, or  hip brought on by ambulation:    Pain in feet at night that wakes you up from your sleep:     Blood clot in your veins:    Leg swelling:         Pulmonary    Oxygen at home:    Productive cough:     Wheezing:         Neurologic    Sudden weakness in arms or legs:     Sudden numbness in arms or legs:     Sudden onset of difficulty speaking or slurred speech:    Temporary loss of vision in one eye:     Problems with dizziness:         Gastrointestinal    Blood in stool:     Vomited blood:         Genitourinary    Burning when urinating:     Blood in urine:         Psychiatric    Major depression:         Hematologic    Bleeding problems:    Problems with blood clotting too easily:        Skin    Rashes or ulcers:        Constitutional    Fever or chills:      PHYSICAL EXAM: Vitals:   11/02/20 0853  BP: (!) 172/80  Pulse: 72  Resp: 18  Temp: 98.1 F (36.7 C)  TempSrc: Temporal  SpO2: 94%  Weight: 183 lb (83 kg)  Height: 6' (1.829 m)    GENERAL: The patient is a well-nourished male, in no acute distress. The vital signs are documented above. CARDIAC: There is a regular rate and rhythm.  VASCULAR:  Palpable femoral pulses both groins Left PT palpable No palpable right pedal pulses No lower extremity tissue loss  DATA:   Bilateral external iliac artery stents are widely patent on arterial duplex today.  ABI's 0.70 right biphasic and 1.03 left biphasic   Assessment/Plan:  70 year old male status post bilateral external iliac artery intervention for critical limb ischemia with rest pain in the right lower extremity and claudication in the left leg.  Both of his iliac stents look widely patent today.  He has palpable femoral pulses on exam.  He is having no lower extremity symptoms.  Discussed the importance of smoking cessation.  He will stay on dual antiplatelet therapy.  I will have him follow-up in 1 year with aortoiliac duplex and ABIs in the [facility]   Cephus Shelling, MD [facility]: [phone]

## 2020-11-15 ENCOUNTER — Telehealth: Payer: Self-pay

## 2020-11-15 NOTE — Telephone Encounter (Signed)
Per Joy:  Christen Butter, NP  You 3 days ago     Please fax the demographic sheet to [facility] ( same fax number that is on the CPAP prescription) ASAP. That is all they are waiting on.   Thanks,  Joy     Demographic sheet, prescription order for CPAP machine, copy of sleep study report, and office notes re-faxed to [facility] on 11/12/20. Fax confirmation received. I called [facility] and spoke to a rep and verified they received the fax. They did receive the fax and stated they would call the pt early this week to see about getting him scheduled. The problem is that Res Med CPAP machines were recalled months ago and [employer] was having a problem getting chips for the machines. She said that the machines are finally coming in now, but they only get so many machines a week. And when they get the machines they are having to schedule the pts that have been waiting to get set up with a resp therapist. I was told that he would be contacted earlier this week and they would try to get him worked in [employer].   I called Jason Carson late in the afternoon on 11/12/20 and shared this info with him. He was not happy with the situation, but this is a problem with all CPAP DME companies. I told him that if he had not heard from them by Wednesday to give me a call and I will call them back and find out what the problem is. No further questions or concerns at this time.

## 2020-11-15 NOTE — Telephone Encounter (Signed)
Please see below.

## 2020-12-08 ENCOUNTER — Ambulatory Visit (INDEPENDENT_AMBULATORY_CARE_PROVIDER_SITE_OTHER): Payer: Medicare Other | Admitting: Medical-Surgical

## 2020-12-08 DIAGNOSIS — Z122 Encounter for screening for malignant neoplasm of respiratory organs: Secondary | ICD-10-CM

## 2020-12-08 DIAGNOSIS — F17200 Nicotine dependence, unspecified, uncomplicated: Secondary | ICD-10-CM

## 2020-12-08 NOTE — Patient Instructions (Addendum)
MEDICARE ANNUAL WELLNESS VISIT Health Maintenance Summary and Written Plan of Care  Mr. Senn ,  Thank you for allowing me to perform your Medicare Annual Wellness Visit and for your ongoing commitment to your health.   Health Maintenance & Immunization History Health Maintenance  Topic Date Due   COVID-19 Vaccine (2 - Booster for Janssen series) 12/24/2020 (Originally 10/31/2019)   Zoster Vaccines- Shingrix (1 of 2) 03/10/2021 (Originally 05/25/2000)   COLONOSCOPY (Pts 45-44yrs Insurance coverage will need to be confirmed)  12/08/2021 (Originally 05/26/1995)   TETANUS/TDAP  12/08/2021 (Originally 05/25/1969)   PNA vac Low Risk Adult (1 of 2 - PCV13) 12/08/2021 (Originally 05/26/2015)   INFLUENZA VACCINE  12/27/2020   Hepatitis C Screening  Completed   HPV VACCINES  Aged Out   Immunization History  Administered Date(s) Administered   Fluad Quad(high Dose 65+) 02/09/2020   Linwood Dibbles (J&J) SARS-COV-2 Vaccination 09/05/2019    These are the patient goals that we discussed:  Goals Addressed               This Visit's Progress     Patient Stated (pt-stated)        12/08/2020 AWV Goal: Tobacco Cessation  Smoking cessation instruction/counseling given:  counseled patient on the dangers of tobacco use, advised patient to stop smoking, and reviewed strategies to maximize success  Patient will verbalize understanding of the health risks associated with smoking/tobacco use Lung cancer or lung disease, such as COPD Heart disease. Stroke. Heart attack Infertility Osteoporosis and bone fractures. Patient will create a plan to quit smoking/using tobacco Pick a date to quit.  Write down the reasons why you are quitting and put it where you will see it often. Identify the people, places, things, and activities that make you want to smoke (triggers) and avoid them. Make sure to take these actions: Throw away all cigarettes at home, at work, and in your car. Throw away smoking  accessories, such as Set designer. Clean your car and make sure to empty the ashtray. Clean your home, including curtains and carpets. Tell your family, friends, and coworkers that you are quitting. Support from your loved ones can make quitting easier. Talk with your health care provider about your options for quitting smoking. Find out what treatment options are covered by your health insurance. Patient will be able to demonstrate knowledge of tobacco cessation strategies that may maximize success Quitting "cold [place]" is more successful than gradually quitting. Attending in-person counseling to help you build problem-solving skills.  Finding resources and support systems that can help you to quit smoking and remain smoke-free after you quit. These resources are most helpful when you use them often. They can include: Online chats with a Veterinary surgeon. Telephone quitlines. Printed Materials engineer. Support groups or group counseling. Text messaging programs. Mobile phone applications. Taking medicines to help you quit smoking: Nicotine patches, gum, or lozenges. Nicotine inhalers or sprays. Non-nicotine medicine that is taken by mouth. Patient will note get discouraged if the process is difficult Over the next year, patient will stop smoking or using other forms of tobacco  Smoking cessation instruction/counseling given:  counseled patient on the dangers of tobacco use, advised patient to stop smoking, and reviewed strategies to maximize success            This is a list of Health Maintenance Items that are overdue or due now: Pneumococcal vaccine  Td vaccine Colorectal cancer screening Shingles vaccine Eye exam   Orders/Referrals Placed Today: Orders Placed This Encounter  Procedures   Ambulatory Referral for Lung Cancer Scre    Referral Priority:   Routine    Referral Type:   Consultation    Referral Reason:   Specialty Services Required    Number of Visits  Requested:   1   (Contact our referral department at [phone] if you have not spoken with someone about your referral appointment within the next 5 days)    Follow-up Plan Follow-up with Christen Butter, NP as planned Lung cancer screening referral has been sent.  Medicare wellness visit in one year.  Patient will access AVS on mychart.

## 2020-12-08 NOTE — Progress Notes (Signed)
MEDICARE ANNUAL WELLNESS VISIT  12/08/2020  Telephone Visit Disclaimer This Medicare AWV was conducted by telephone due to national recommendations for restrictions regarding the COVID-19 Pandemic (e.g. social distancing).  I verified, using two identifiers, that I am speaking with Jason Carson or their authorized healthcare agent. I discussed the limitations, risks, security, and privacy concerns of performing an evaluation and management service by telephone and the potential availability of an in-person appointment in the future. The patient expressed understanding and agreed to proceed.  Location of Patient: Home Location of Provider (nurse):  In the office.  Subjective:    Jason Carson is a 70 y.o. male patient of Christen ButterJessup, Joy, NP who had a Medicare Annual Wellness Visit today via telephone. Jason Carson is Retired. he reports that he is socially active and does interact with friends/family regularly. he is minimally physically active and enjoys woodworking.  Patient Care Team: Christen ButterJessup, Joy, NP as PCP - General (Nurse Practitioner)  Advanced Directives 12/08/2020 01/28/2020  Does Patient Have a Medical Advance Directive? No No  Would patient like information on creating a medical advance directive? No - Patient declined No - Patient declined    Hospital Utilization Over the Past 12 Months: # of hospitalizations or ER visits: 0 # of surgeries: 1  Review of Systems    Patient reports that his overall health is better compared to last year.  History obtained from chart review and the patient  Patient Reported Readings (BP, Pulse, CBG, Weight, etc) none  Pain Assessment Pain : No/denies pain     Current Medications & Allergies (verified) Allergies as of 12/08/2020   No Known Allergies      Medication List        Accurate as of December 08, 2020  1:39 PM. If you have any questions, ask your nurse or doctor.          AMBULATORY NON FORMULARY MEDICATION Continuous  positive airway pressure (CPAP) machine set on AutoPAP (4-20 cmH2O), with all supplemental supplies as needed.   amLODipine 2.5 MG tablet Commonly known as: NORVASC Take 1 tablet (2.5 mg total) by mouth daily.   aspirin EC 81 MG tablet Take 1 tablet (81 mg total) by mouth daily. Swallow whole.   atorvastatin 10 MG tablet Commonly known as: Lipitor Take 1 tablet (10 mg total) by mouth daily.   chlorhexidine 0.12 % solution Commonly known as: PERIDEX Use as directed 15 mLs in the mouth or throat at bedtime.   clopidogrel 75 MG tablet Commonly known as: Plavix Take 1 tablet (75 mg total) by mouth daily.   doxazosin 8 MG tablet Commonly known as: CARDURA TAKE 1 TABLET BY MOUTH AT BEDTIME   multivitamin with minerals Tabs tablet Take 1 tablet by mouth daily.   PHENYLEPHRINE HCL NA Place 1 spray into the nose daily as needed (congestion).        History (reviewed): Past Medical History:  Diagnosis Date   Atherosclerosis    Benign essential tremor    Past Surgical History:  Procedure Laterality Date   ABDOMINAL AORTOGRAM W/LOWER EXTREMITY N/A 01/28/2020   Procedure: ABDOMINAL AORTOGRAM W/LOWER EXTREMITY;  Surgeon: Cephus Shellinglark, Christopher J, MD;  Location: [facility];  Service: Cardiovascular;  Laterality: N/A;   PERIPHERAL VASCULAR INTERVENTION Bilateral 01/28/2020   Procedure: PERIPHERAL VASCULAR INTERVENTION;  Surgeon: Cephus Shellinglark, Christopher J, MD;  Location: [facility];  Service: Cardiovascular;  Laterality: Bilateral;  external iliac stents   Family History  Problem Relation Age of Onset  Hypertension Mother    Hyperlipidemia Mother    Social History   Socioeconomic History   Marital status: Single    Spouse name: Not on file   Number of children: Not on file   Years of education: Not on file   Highest education level: Not on file  Occupational History    Comment: Retired.  Tobacco Use   Smoking status: Every Day    Packs/day: 1.00    Years: 30.00     Pack years: 30.00    Types: Cigarettes   Smokeless tobacco: Never  Vaping Use   Vaping Use: Never used  Substance and Sexual Activity   Alcohol use: Yes    Alcohol/week: 8.0 - 10.0 standard drinks    Types: 8 - 10 Standard drinks or equivalent per week   Drug use: Never   Sexual activity: Yes  Other Topics Concern   Not on file  Social History Narrative   Patient did not want to answer   Social Determinants of Health   Financial Resource Strain: Low Risk    Difficulty of Paying Living Expenses: Not hard at all  Food Insecurity: No Food Insecurity   Worried About Programme researcher, broadcasting/film/video in the Last Year: Never true   Ran Out of Food in the Last Year: Never true  Transportation Needs: No Transportation Needs   Lack of Transportation (Medical): No   Lack of Transportation (Non-Medical): No  Physical Activity: Inactive   Days of Exercise per Week: 0 days   Minutes of Exercise per Session: 0 min  Stress: No Stress Concern Present   Feeling of Stress : Not at all  Social Connections: Unknown   Frequency of Communication with Friends and Family: More than three times a week   Frequency of Social Gatherings with Friends and Family: Three times a week   Attends Religious Services: Patient refused   Active Member of Clubs or Organizations: Patient refused   Attends Banker Meetings: Patient refused   Marital Status: Patient refused    Activities of Daily Living No flowsheet data found.  Patient Education/ Literacy How often do you need to have someone help you when you read instructions, pamphlets, or other written materials from your doctor or pharmacy?: 1 - Never (Patient declined to answer to questions.)  Exercise    Diet Patient reports consuming 3 meals a day and 0 snack(s) a day Patient reports that his primary diet is: Regular Patient reports that she does have regular access to food.   Depression Screen PHQ 2/9 Scores 12/08/2020 12/04/2019  PHQ - 2 Score 0  0  PHQ- 9 Score - 0     Fall Risk Fall Risk  12/08/2020 12/04/2019  Falls in the past year? 0 1  Number falls in past yr: 0 -  Injury with Fall? 0 -  Risk for fall due to : No Fall Risks -  Follow up Falls evaluation completed Falls evaluation completed     Objective:  Jason Carson seemed alert and oriented and he participated appropriately during our telephone visit.  Blood Pressure Weight BMI  BP Readings from Last 3 Encounters:  11/02/20 (!) 172/80  08/09/20 (!) 149/77  05/10/20 (!) 164/88   Wt Readings from Last 3 Encounters:  11/02/20 183 lb (83 kg)  08/09/20 185 lb 12.8 oz (84.3 kg)  05/10/20 183 lb 9.6 oz (83.3 kg)   BMI Readings from Last 1 Encounters:  11/02/20 24.82 kg/m    *Unable to obtain  current vital signs, weight, and BMI due to telephone visit type  Hearing/Vision  Jason Carson did not seem to have difficulty with hearing/understanding during the telephone conversation Reports that he has not had a formal eye exam by an eye care professional within the past year Reports that he has not had a formal hearing evaluation within the past year *Unable to fully assess hearing and vision during telephone visit type  Cognitive Function: No flowsheet data found. (Normal:0-7, Significant for Dysfunction: >8)  Normal Cognitive Function Screening: Yes   Immunization & Health Maintenance Record Immunization History  Administered Date(s) Administered   Fluad Quad(high Dose 65+) 02/09/2020   Linwood Dibbles (J&J) SARS-COV-2 Vaccination 09/05/2019    Health Maintenance  Topic Date Due   TETANUS/TDAP  Never done   COLONOSCOPY (Pts 45-13yrs Insurance coverage will need to be confirmed)  Never done   Zoster Vaccines- Shingrix (1 of 2) Never done   PNA vac Low Risk Adult (1 of 2 - PCV13) Never done   COVID-19 Vaccine (2 - Booster for Janssen series) 10/31/2019   INFLUENZA VACCINE  12/27/2020   Hepatitis C Screening  Completed   HPV VACCINES  Aged Out       Assessment   This is a routine wellness examination for Jason Carson.  Health Maintenance: Due or Overdue Health Maintenance Due  Topic Date Due   TETANUS/TDAP  Never done   COLONOSCOPY (Pts 45-41yrs Insurance coverage will need to be confirmed)  Never done   Zoster Vaccines- Shingrix (1 of 2) Never done   PNA vac Low Risk Adult (1 of 2 - PCV13) Never done   COVID-19 Vaccine (2 - Booster for [employer] series) 10/31/2019    Jason Carson does not need a referral for Community Assistance: Care Management:   no Social Work:    no Prescription Assistance:  no Nutrition/Diabetes Education:  no   Plan:  Personalized Goals  Goals Addressed               This Visit's Progress     Patient Stated (pt-stated)        12/08/2020 AWV Goal: Tobacco Cessation  Smoking cessation instruction/counseling given:  counseled patient on the dangers of tobacco use, advised patient to stop smoking, and reviewed strategies to maximize success  Patient will verbalize understanding of the health risks associated with smoking/tobacco use Lung cancer or lung disease, such as COPD Heart disease. Stroke. Heart attack Infertility Osteoporosis and bone fractures. Patient will create a plan to quit smoking/using tobacco Pick a date to quit.  Write down the reasons why you are quitting and put it where you will see it often. Identify the people, places, things, and activities that make you want to smoke (triggers) and avoid them. Make sure to take these actions: Throw away all cigarettes at home, at work, and in your car. Throw away smoking accessories, such as Set designer. Clean your car and make sure to empty the ashtray. Clean your home, including curtains and carpets. Tell your family, friends, and coworkers that you are quitting. Support from your loved ones can make quitting easier. Talk with your health care provider about your options for quitting smoking. Find out what treatment options are  covered by your health insurance. Patient will be able to demonstrate knowledge of tobacco cessation strategies that may maximize success Quitting "cold [place]" is more successful than gradually quitting. Attending in-person counseling to help you build problem-solving skills.  Finding resources and support systems that can help you  to quit smoking and remain smoke-free after you quit. These resources are most helpful when you use them often. They can include: Online chats with a Veterinary surgeon. Telephone quitlines. Printed Materials engineer. Support groups or group counseling. Text messaging programs. Mobile phone applications. Taking medicines to help you quit smoking: Nicotine patches, gum, or lozenges. Nicotine inhalers or sprays. Non-nicotine medicine that is taken by mouth. Patient will note get discouraged if the process is difficult Over the next year, patient will stop smoking or using other forms of tobacco  Smoking cessation instruction/counseling given:  counseled patient on the dangers of tobacco use, advised patient to stop smoking, and reviewed strategies to maximize success          Personalized Health Maintenance & Screening Recommendations  Pneumococcal vaccine  Td vaccine Colorectal cancer screening Shingles vaccine Eye exam  Lung Cancer Screening Recommended: yes (Low Dose CT Chest recommended if Age 69-80 years, 30 pack-year currently smoking OR have quit w/in past 15 years) Hepatitis C Screening recommended: no HIV Screening recommended: no  Advanced Directives: Written information was not prepared per patient's request.  Referrals & Orders No orders of the defined types were placed in this encounter.   Follow-up Plan Follow-up with Christen Butter, NP as planned Lung cancer screening referral has been sent.  Medicare wellness visit in one year.  Patient will access AVS on mychart.    I have personally reviewed and noted the following in the patient's  chart:   Medical and social history Use of alcohol, tobacco or illicit drugs  Current medications and supplements Functional ability and status Nutritional status Physical activity Advanced directives List of other physicians Hospitalizations, surgeries, and ER visits in previous 12 months Vitals Screenings to include cognitive, depression, and falls Referrals and appointments  In addition, I have reviewed and discussed with Jason Carson certain preventive protocols, quality metrics, and best practice recommendations. A written personalized care plan for preventive services as well as general preventive health recommendations is available and can be mailed to the patient at his request.      Modesto Charon, RN  12/08/2020

## 2020-12-15 ENCOUNTER — Other Ambulatory Visit: Payer: Self-pay | Admitting: *Deleted

## 2020-12-15 DIAGNOSIS — Z87891 Personal history of nicotine dependence: Secondary | ICD-10-CM

## 2020-12-15 DIAGNOSIS — F17210 Nicotine dependence, cigarettes, uncomplicated: Secondary | ICD-10-CM

## 2020-12-17 ENCOUNTER — Other Ambulatory Visit: Payer: Self-pay | Admitting: Medical-Surgical

## 2020-12-17 DIAGNOSIS — I10 Essential (primary) hypertension: Secondary | ICD-10-CM

## 2020-12-31 ENCOUNTER — Other Ambulatory Visit: Payer: Self-pay | Admitting: Medical-Surgical

## 2020-12-31 ENCOUNTER — Other Ambulatory Visit: Payer: Self-pay | Admitting: Vascular Surgery

## 2020-12-31 ENCOUNTER — Encounter: Payer: Self-pay | Admitting: Medical-Surgical

## 2020-12-31 DIAGNOSIS — I10 Essential (primary) hypertension: Secondary | ICD-10-CM

## 2020-12-31 MED ORDER — DOXAZOSIN MESYLATE 8 MG PO TABS: 8.0000 mg | ORAL_TABLET | Freq: Every day | ORAL | 0 refills | Status: DC

## 2020-12-31 MED ORDER — AMLODIPINE BESYLATE 2.5 MG PO TABS: 2.5000 mg | ORAL_TABLET | Freq: Every day | ORAL | 0 refills | Status: DC

## 2021-01-03 ENCOUNTER — Other Ambulatory Visit: Payer: Self-pay | Admitting: Medical-Surgical

## 2021-01-15 ENCOUNTER — Other Ambulatory Visit: Payer: Self-pay | Admitting: Medical-Surgical

## 2021-01-15 DIAGNOSIS — I10 Essential (primary) hypertension: Secondary | ICD-10-CM

## 2021-01-19 ENCOUNTER — Encounter: Payer: Self-pay | Admitting: Acute Care

## 2021-01-19 ENCOUNTER — Other Ambulatory Visit: Payer: Self-pay

## 2021-01-19 ENCOUNTER — Ambulatory Visit (INDEPENDENT_AMBULATORY_CARE_PROVIDER_SITE_OTHER): Payer: Medicare Other

## 2021-01-19 ENCOUNTER — Telehealth (INDEPENDENT_AMBULATORY_CARE_PROVIDER_SITE_OTHER): Payer: Medicare Other | Admitting: Acute Care

## 2021-01-19 DIAGNOSIS — F17210 Nicotine dependence, cigarettes, uncomplicated: Secondary | ICD-10-CM | POA: Diagnosis not present

## 2021-01-19 DIAGNOSIS — Z122 Encounter for screening for malignant neoplasm of respiratory organs: Secondary | ICD-10-CM

## 2021-01-19 DIAGNOSIS — Z87891 Personal history of nicotine dependence: Secondary | ICD-10-CM | POA: Diagnosis not present

## 2021-01-19 NOTE — Patient Instructions (Signed)
Thank you for participating in the [facility]. It was our pleasure to meet you today. We will call you with the results of your scan within the next few days. Your scan will be assigned a Lung RADS category score by the physicians reading the scans.  This Lung RADS score determines follow up scanning.  See below for description of categories, and follow up screening recommendations. We will be in touch to schedule your follow up screening annually or based on recommendations of our providers. We will fax a copy of your scan results to your Primary Care Physician, or the physician who referred you to the program, to ensure they have the results. Please call the office if you have any questions or concerns regarding your scanning experience or results.  Our office number is [phone]. Please speak with Denise Phelps, RN. She is our Lung Cancer Screening RN. If she is unavailable when you call, please have the office staff send her a message. She will return your call at her earliest convenience. Remember, if your scan is normal, we will scan you annually as long as you continue to meet the criteria for the program. (Age 55-77, Current smoker or smoker who has quit within the last 15 years). If you are a smoker, remember, quitting is the single most powerful action that you can take to decrease your risk of lung cancer and other pulmonary, breathing related problems. We know quitting is hard, and we are here to help.  Please let us know if there is anything we can do to help you meet your goal of quitting. If you are a former smoker, congratulations. We are proud of you! Remain smoke free! Remember you can refer friends or family members through the number above.  We will screen them to make sure they meet criteria for the program. Thank you for helping us take better care of you by participating in Lung Screening.  Lung RADS Categories:  Lung RADS 1: no nodules  or definitely non-concerning nodules.  Recommendation is for a repeat annual scan in 12 months.  Lung RADS 2:  nodules that are non-concerning in appearance and behavior with a very low likelihood of becoming an active cancer. Recommendation is for a repeat annual scan in 12 months.  Lung RADS 3: nodules that are probably non-concerning , includes nodules with a low likelihood of becoming an active cancer.  Recommendation is for a 6-month repeat screening scan. Often noted after an upper respiratory illness. We will be in touch to make sure you have no questions, and to schedule your 6-month scan.  Lung RADS 4 A: nodules with concerning findings, recommendation is most often for a follow up scan in 3 months or additional testing based on our provider's assessment of the scan. We will be in touch to make sure you have no questions and to schedule the recommended 3 month follow up scan.  Lung RADS 4 B:  indicates findings that are concerning. We will be in touch with you to schedule additional diagnostic testing based on our provider's  assessment of the scan.   

## 2021-01-19 NOTE — Progress Notes (Signed)
Virtual Visit via Video Note  I connected with Jason Carson on 01/19/21 at 11:30 AM EDT by a video enabled telemedicine application and verified that I am speaking with the correct person using two identifiers.  Location: Patient: At Home Provider: [address], [place], [place], [address]    I discussed the limitations of evaluation and management by telemedicine and the availability of in person appointments. The patient expressed understanding and agreed to proceed.  Shared Decision Making Visit Lung Cancer Screening Program [phone])   Eligibility: Age 70 y.o. Pack Years Smoking History Calculation 50 pack year smoking history (# packs/per year x # years smoked) Recent History of coughing up blood  no Unexplained weight loss? no ( >Than 15 pounds within the last 6 months ) Prior History Lung / other cancer no (Diagnosis within the last 5 years already requiring surveillance chest CT Scans). Smoking Status Current Smoker Former Smokers: Years since quit: NA  Quit Date: NA  Visit Components: Discussion included one or more decision making aids. yes Discussion included risk/benefits of screening. yes Discussion included potential follow up diagnostic testing for abnormal scans. yes Discussion included meaning and risk of over diagnosis. yes Discussion included meaning and risk of False Positives. yes Discussion included meaning of total radiation exposure. yes  Counseling Included: Importance of adherence to annual lung cancer LDCT screening. yes Impact of comorbidities on ability to participate in the program. yes Ability and willingness to under diagnostic treatment. yes  Smoking Cessation Counseling: Current Smokers:  Discussed importance of smoking cessation. yes Information about tobacco cessation classes and interventions provided to patient. yes Patient provided with "ticket" for LDCT Scan. yes Symptomatic Patient. no  Counseling Diagnosis Code: Tobacco  Use Z72.0 Asymptomatic Patient yes  Counseling (Intermediate counseling: > three minutes counseling) L8453 Former Smokers:  Discussed the importance of maintaining cigarette abstinence. yes Diagnosis Code: Personal History of Nicotine Dependence. 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 Yes Patient provided with "ticket" for LDCT Scan. yes Written Order for Lung Cancer Screening with LDCT placed in Epic. Yes (CT Chest Lung Cancer Screening Low Dose W/O CM) [id number] Z12.2-Screening of respiratory organs Z87.891-Personal history of nicotine dependence  I have spent 25 minutes of face to face time with Jason Carson discussing the risks and benefits of lung cancer screening. We viewed a power point together that explained in detail the above noted topics. We paused at intervals to allow for questions to be asked and answered to ensure understanding.We discussed that the single most powerful action that he can take to decrease his risk of developing lung cancer is to quit smoking. We discussed whether or not he is ready to commit to setting a quit date. We discussed options for tools to aid in quitting smoking including nicotine replacement therapy, non-nicotine medications, support groups, Quit Smart classes, and behavior modification. We discussed that often times setting smaller, more achievable goals, such as eliminating 1 cigarette a day for a week and then 2 cigarettes a day for a week can be helpful in slowly decreasing the number of cigarettes smoked. This allows for a sense of accomplishment as well as providing a clinical benefit. I gave him the " Be Stronger Than Your Excuses" card with contact information for community resources, classes, free nicotine replacement therapy, and access to mobile apps, text messaging, and on-line smoking cessation help. I have also given him my card and contact information in the event he needs to contact me. We  discussed the time and location of the scan, and that either Abigail Miyamoto RN or I will call with the results within 24-48 hours of receiving them. I have offered him  a copy of the power point we viewed  as a resource in the event they need reinforcement of the concepts we discussed today in the office. The patient verbalized understanding of all of  the above and had no further questions upon leaving the office. They have my contact information in the event they have any further questions.  I spent 3 minutes counseling on smoking cessation and the health risks of continued tobacco abuse.  I explained to the patient that there has been a high incidence of coronary artery disease noted on these exams. I explained that this is a non-gated exam therefore degree or severity cannot be determined. This patient is on statin therapy. I have asked the patient to follow-up with their PCP regarding any incidental finding of coronary artery disease and management with diet or medication as their PCP  feels is clinically indicated. The patient verbalized understanding of the above and had no further questions upon completion of the visit.      Bevelyn Ngo, NP 01/19/2021

## 2021-01-31 NOTE — Progress Notes (Signed)
Please call patient and let them  know their  low dose Ct was read as a Lung RADS 2: nodules that are benign in appearance and behavior with a very low likelihood of becoming a clinically active cancer due to size or lack of growth. Recommendation per radiology is for a repeat LDCT in 12 months. .Please let them  know we will order and schedule their  annual screening scan for 12/2021. Please let them  know there was notation of CAD on their  scan.  Please remind the patient  that this is a non-gated exam therefore degree or severity of disease  cannot be determined. Please have them  follow up with their PCP regarding potential risk factor modification, dietary therapy or pharmacologic therapy if clinically indicated. Pt.  is  currently on statin therapy. Please place order for annual  screening scan for  12/2021 and fax results to PCP. Thanks so much.  + CAD, + Statin, Has been seen by cardiology in the past. Have him follow up with PCP or cards. Thanks so much

## 2021-02-01 ENCOUNTER — Encounter: Payer: Self-pay | Admitting: *Deleted

## 2021-02-01 DIAGNOSIS — F17210 Nicotine dependence, cigarettes, uncomplicated: Secondary | ICD-10-CM

## 2021-02-01 DIAGNOSIS — Z87891 Personal history of nicotine dependence: Secondary | ICD-10-CM

## 2021-02-08 ENCOUNTER — Other Ambulatory Visit: Payer: Self-pay | Admitting: Medical-Surgical

## 2021-02-08 DIAGNOSIS — I10 Essential (primary) hypertension: Secondary | ICD-10-CM

## 2021-02-09 ENCOUNTER — Ambulatory Visit (INDEPENDENT_AMBULATORY_CARE_PROVIDER_SITE_OTHER): Payer: Medicare Other | Admitting: Medical-Surgical

## 2021-02-09 ENCOUNTER — Encounter: Payer: Self-pay | Admitting: Medical-Surgical

## 2021-02-09 ENCOUNTER — Telehealth: Payer: Self-pay | Admitting: Medical-Surgical

## 2021-02-09 VITALS — BP 135/77 | HR 78 | Resp 20 | Ht 72.0 in | Wt 186.0 lb

## 2021-02-09 DIAGNOSIS — I10 Essential (primary) hypertension: Secondary | ICD-10-CM

## 2021-02-09 DIAGNOSIS — G4733 Obstructive sleep apnea (adult) (pediatric): Secondary | ICD-10-CM

## 2021-02-09 DIAGNOSIS — N5089 Other specified disorders of the male genital organs: Secondary | ICD-10-CM | POA: Diagnosis not present

## 2021-02-09 DIAGNOSIS — Z9989 Dependence on other enabling machines and devices: Secondary | ICD-10-CM

## 2021-02-09 NOTE — Telephone Encounter (Signed)
Please contact [employer] regarding his CPAP machine.  We were supposed to receive a report regarding his usage and specifications.  We have not received such the patient has not seen anything either.  Please have them fax that to [place] as soon as possible.  Thanks, Thayer Ohm, DNP, APRN, FNP-BC [facility] and Sports Medicine

## 2021-02-09 NOTE — Progress Notes (Signed)
  HPI with pertinent ROS:   CC: OSA/HTN follow up  HPI: Very pleasant 70 year old male presenting today for hypertension and OSA follow-up.  Hypertension-has been checking his blood pressure 2-3 times daily regularly.  Continues to experience morning surge blood pressures with systolics in the 140s-150s and diastolics in the 80s-90s.  Has not noticed much of a difference while using the CPAP for the last couple of months.  He continues to take amlodipine 2.5 mg daily and Cardura 8 mg nightly.  Blood pressures in the afternoons and evenings are normal to low normal.  Denies chest pain, palpitations, lower extremity edema, vision changes, headaches, and dizziness.  Does endorse occasional shortness of breath with exertion but this is likely related to long-term smoking.  Continues to smoke cigarettes approximately 1 pack/day.  Sleep apnea-has been wearing his CPAP as prescribed although he does admit to struggling with the CPAP itself and getting used to it.  Has not noticed much of a change in his overall symptoms.  Keeping track of the hours that he wears his CPAP and doing very well overall.  Has had some difficulty with pressures and admits to making some changes so that he is able to better tolerate it.  Reports that [facility] told him that the machine was communicating with [place] regularly through cellular transmission but we do not have a report.  If we are able to get the report, he would like to see you the information for himself.  Testicular swelling-the last few months, has noticed that his left testicle is greatly enlarged to about the size of a small lumen.  It is not painful and he reports it is soft and squishy.  The right side of his testicles remains unchanged.  He has had no change in urinary pattern.  I reviewed the past medical history, family history, social history, surgical history, and allergies today and no changes were needed.  Please see the problem list section below in epic for  further details.  Physical exam:   General: Well Developed, well nourished, and in no acute distress.  Neuro: Alert and oriented x3.  HEENT: Normocephalic, atraumatic.  Skin: Warm and dry. Cardiac: Regular rate and rhythm, no murmurs rubs or gallops, no lower extremity edema.  Respiratory: Clear to auscultation bilaterally. Not using accessory muscles, speaking in full sentences.  Impression and Recommendations:    1. Essential hypertension Checking CMP and lipid panel today.  Concern with continued morning surge blood pressure and the elevated risk of stroke.  After discussion, referring to advanced hypertension clinic for evaluation of regimen and evaluation of potential causes.  For now, continue amlodipine and Cardura as prescribed. - COMPLETE METABOLIC PANEL WITH GFR - Lipid panel - Ambulatory referral to Advanced Hypertension Clinic - [facility] [address]  2. OSA on CPAP Continue wearing CPAP.  We will contact [employer] for a usage report.  3. Testicle swelling Unclear etiology.  Suspect a hydrocele but getting an ultrasound to rule out potential malignancy. - US Scrotum; Future  Return in about 6 months (around 08/09/2021) for HLD/HTN follow up. ___________________________________________ Thayer Ohm, DNP, APRN, FNP-BC Primary Care and Sports Medicine [facility] [place]

## 2021-02-10 LAB — COMPLETE METABOLIC PANEL WITH GFR
AG Ratio: 2.1 (calc) (ref 1.0–2.5)
ALT: 18 U/L (ref 9–46)
AST: 16 U/L (ref 10–35)
Albumin: 4.4 g/dL (ref 3.6–5.1)
Alkaline phosphatase (APISO): 65 U/L (ref 35–144)
BUN: 12 mg/dL (ref 7–25)
CO2: 29 mmol/L (ref 20–32)
Calcium: 9.2 mg/dL (ref 8.6–10.3)
Chloride: 102 mmol/L (ref 98–110)
Creat: 0.87 mg/dL (ref 0.70–1.28)
Globulin: 2.1 g/dL (calc) (ref 1.9–3.7)
Glucose, Bld: 92 mg/dL (ref 65–99)
Potassium: 4.5 mmol/L (ref 3.5–5.3)
Sodium: 139 mmol/L (ref 135–146)
Total Bilirubin: 0.5 mg/dL (ref 0.2–1.2)
Total Protein: 6.5 g/dL (ref 6.1–8.1)
eGFR: 93 mL/min/{1.73_m2} (ref >=60)

## 2021-02-10 LAB — LIPID PANEL
Cholesterol: 167 mg/dL (ref <200)
HDL: 76 mg/dL (ref >=40)
LDL Cholesterol (Calc): 76 mg/dL (calc)
Non-HDL Cholesterol (Calc): 91 mg/dL (calc) (ref <130)
Total CHOL/HDL Ratio: 2.2 (calc) (ref <5.0)
Triglycerides: 70 mg/dL (ref <150)

## 2021-02-14 ENCOUNTER — Ambulatory Visit (INDEPENDENT_AMBULATORY_CARE_PROVIDER_SITE_OTHER): Payer: Medicare Other

## 2021-02-14 ENCOUNTER — Other Ambulatory Visit: Payer: Self-pay

## 2021-02-14 DIAGNOSIS — N5089 Other specified disorders of the male genital organs: Secondary | ICD-10-CM | POA: Diagnosis not present

## 2021-02-16 ENCOUNTER — Encounter: Payer: Self-pay | Admitting: Medical-Surgical

## 2021-03-05 ENCOUNTER — Other Ambulatory Visit: Payer: Self-pay | Admitting: Medical-Surgical

## 2021-03-05 DIAGNOSIS — I10 Essential (primary) hypertension: Secondary | ICD-10-CM

## 2021-03-08 NOTE — Addendum Note (Signed)
Addended byChristen Butter on: 03/08/2021 07:05 AM   Modules accepted: Level of Service

## 2021-03-30 ENCOUNTER — Other Ambulatory Visit: Payer: Self-pay | Admitting: Medical-Surgical

## 2021-03-30 DIAGNOSIS — I10 Essential (primary) hypertension: Secondary | ICD-10-CM

## 2021-03-31 ENCOUNTER — Encounter: Payer: Self-pay | Admitting: Medical-Surgical

## 2021-04-11 ENCOUNTER — Encounter (HOSPITAL_BASED_OUTPATIENT_CLINIC_OR_DEPARTMENT_OTHER): Payer: Self-pay

## 2021-04-11 NOTE — Progress Notes (Signed)
Advanced [facility]pertension [facility] Initial Assessment:    Date:  04/12/2021   ID:  Jason Carson, DOB 07/12/1950, MRN [id number]  PCP:  Christen Butter, NP  Cardiologist:  None  Nephrologist:  Referring MD: Christen Butter, NP   CC: Hypertension  History of Present Illness:    Jason Carson is a 70 y.o. male with a hx of hypertension, critical limb ischemia, tobacco abuse, and sleep apnea (not consistently on CPAP) here to establish care in the [facility]. He last saw Christen Butter, NP on 02/09/21 for hypertension. He checked his blood pressure 2-3 times daily and noticed the readings were higher in the morning and normal or low later in the day. He continued to take amlodipine 2.5mg  daily and Cardura 8mg  nightly.  He was referred to the hypertension clinic for evaluation of potential causes to the morning surge in his blood pressure. He sees vascular surgery for PAD. He had stenting of the R and L external iliacs on 01/2020 and has no claudication.   Today, he is feeling fine. He reports his blood pressure is highest in the mornings. These issues started 11/2019 and he began tracking his blood pressures. In the morning, he reports reading of 150s up to 170s systolic and 90s and greater diastolic before his medication. Throughout the day the reading will decrease to 130s to 100s or lower systolic in the evening. His blood pressure was higher in clinic which he thinks maybe due to white coat syndrome. He had undergone a sleep test and tried a CPAP. He reports the CPAP has not been helpful and only makes his neck stiff.  On days when he uses his CPAP he does not see any improvement in his blood pressure.   If he sits down for too long, he will become dizzy for a few seconds. He does not notice the dizziness while lying in bed or during physical activity. He also reports bruising easily along his arms starting prior to his high blood pressure issues. For the past month, he reports a tingling on the  surface of his lateral L thigh without associated numbness.   Mr. Harpenau is active.  He splits and stacks wood but does not perform formal exercise. He has noticed some tingling in his left thigh that he thinks may be related to the woodworking. Exercise causes him to tire and become short of breath quickly. For his diet, he eats a mix of both home cooked and outside food. He does watch his salt intake and limits adding salt. He consumes 1 cup of coffee in the morning and 1-2 units of alcohol every evening. He smokes about 1ppd and has tried to quit. In the past, he has used patches with minimal success. However, the patches left red spots on his skin. He is open to trying gum but only has 3 remaining teeth. His mother and father have both passed away and he believes both may have had high blood pressure. Reportedly, his mother was overweight at 103lbs. He takes doxazosin in the evening around 9-10PM and all other medications in the morning from 9-11AM. He reports needing to urinate 2-3 times throughout the night. He uses a nasal spray daily throughout the day. 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 He denies any palpitations, chest pain, headaches, syncope, orthopnea, PND, or lower extremity edema.   Past Medical History:  Diagnosis Date   Atherosclerosis    Benign essential tremor    RBBB 04/12/2021   Tobacco abuse 04/12/2021  Past Surgical History:  Procedure Laterality Date   ABDOMINAL AORTOGRAM W/LOWER EXTREMITY N/A 01/28/2020   Procedure: ABDOMINAL AORTOGRAM W/LOWER EXTREMITY;  Surgeon: Cephus Shelling, MD;  Location: [facility];  Service: Cardiovascular;  Laterality: N/A;   PERIPHERAL VASCULAR INTERVENTION Bilateral 01/28/2020   Procedure: PERIPHERAL VASCULAR INTERVENTION;  Surgeon: Cephus Shelling, MD;  Location: [facility];  Service: Cardiovascular;  Laterality: Bilateral;  external iliac stents    Current Medications: Current Meds  Medication Sig    AMBULATORY NON FORMULARY MEDICATION Continuous positive airway pressure (CPAP) machine set on AutoPAP (4-20 cmH2O), with all supplemental supplies as needed.   atorvastatin (LIPITOR) 10 MG tablet TAKE 1 TABLET BY MOUTH EVERY DAY   chlorhexidine (PERIDEX) 0.12 % solution Use as directed 15 mLs in the mouth or throat at bedtime.   clopidogrel (PLAVIX) 75 MG tablet TAKE 1 TABLET BY MOUTH EVERY DAY   doxazosin (CARDURA) 8 MG tablet Take 1 tablet (8 mg total) by mouth at bedtime.   Multiple Vitamin (MULTIVITAMIN WITH MINERALS) TABS tablet Take 1 tablet by mouth daily.   [DISCONTINUED] amLODipine (NORVASC) 2.5 MG tablet TAKE 1 TABLET BY MOUTH EVERY DAY   [DISCONTINUED] PHENYLEPHRINE HCL NA Place 1 spray into the nose daily as needed (congestion).     Allergies:   Patient has no known allergies.   Social History   Socioeconomic History   Marital status: Single    Spouse name: Not on file   Number of children: Not on file   Years of education: Not on file   Highest education level: Not on file  Occupational History    Comment: Retired.  Tobacco Use   Smoking status: Every Day    Packs/day: 1.00    Years: 30.00    Pack years: 30.00    Types: Cigarettes   Smokeless tobacco: Never  Vaping Use   Vaping Use: Never used  Substance and Sexual Activity   Alcohol use: Yes    Alcohol/week: 8.0 - 10.0 standard drinks    Types: 8 - 10 Standard drinks or equivalent per week   Drug use: Never   Sexual activity: Yes  Other Topics Concern   Not on file  Social History Narrative   Patient did not want to answer   Social Determinants of Health   Financial Resource Strain: Low Risk    Difficulty of Paying Living Expenses: Not hard at all  Food Insecurity: No Food Insecurity   Worried About Programme researcher, broadcasting/film/video in the Last Year: Never true   Ran Out of Food in the Last Year: Never true  Transportation Needs: No Transportation Needs   Lack of Transportation (Medical): No   Lack of Transportation  (Non-Medical): No  Physical Activity: Inactive   Days of Exercise per Week: 0 days   Minutes of Exercise per Session: 0 min  Stress: No Stress Concern Present   Feeling of Stress : Not at all  Social Connections: Unknown   Frequency of Communication with Friends and Family: More than three times a week   Frequency of Social Gatherings with Friends and Family: Three times a week   Attends Religious Services: Patient refused   Active Member of Clubs or Organizations: Patient refused   Attends Banker Meetings: Patient refused   Marital Status: Patient refused     Family History: The patient's family history includes Hyperlipidemia in his mother; Hypertension in his father and mother.  ROS:   Please see the history  of present illness.    (+) Bilateral UE bruising (+) Dizziness (+) L thigh tingling (+) Shortness of breath with exertion (+) Tobacco use All other systems reviewed and are negative.  EKGs/Labs/Other Studies Reviewed:    CT Chest 01/19/21 -Lung-RADS 2, benign appearance or behavior. Continue annual screening with low-dose chest CT without contrast in 12 months. -Three-vessel coronary artery calcifications, aortic Atherosclerosis (ICD10-I70.0) and Emphysema (ICD10-J43.9).  LE Doppler 11/02/20 Right: Resting right ankle-brachial index indicates moderate right lower  extremity arterial disease. The right toe-brachial index is abnormal. RT  great toe pressure = 113 mmHg.  Left: Resting left ankle-brachial index is within normal range. No  evidence of significant left lower extremity arterial disease. The left  toe-brachial index is normal. LT Great toe pressure = 175 mmHg.   EKG:   04/12/21: Sinus rhythm, rate 71 bpm; RBBB  Recent Labs: 02/09/2021: ALT 18; BUN 12; Creat 0.87; Potassium 4.5; Sodium 139   Recent Lipid Panel    Component Value Date/Time   CHOL 167 02/09/2021 0000   TRIG 70 02/09/2021 0000   HDL 76 02/09/2021 0000   CHOLHDL 2.2  02/09/2021 0000   LDLCALC 76 02/09/2021 0000    Physical Exam:   VS:  BP (!) 178/74 (BP Location: Left Arm, Patient Position: Sitting, Cuff Size: Normal)   Pulse 71   Ht 6' (1.829 m)   Wt 193 lb (87.5 kg)   BMI 26.18 kg/m  , BMI Body mass index is 26.18 kg/m. GENERAL:  Well appearing HEENT: Pupils equal round and reactive, fundi not visualized, oral mucosa unremarkable NECK:  No jugular venous distention, waveform within normal limits, carotid upstroke brisk and symmetric, no bruits, no thyromegaly LUNGS:  Clear to auscultation bilaterally HEART:  RRR.  PMI not displaced or sustained,S1 and S2 within normal limits, no S3, no S4, no clicks, no rubs, no murmurs ABD:  Flat, positive bowel sounds normal in frequency in pitch, no bruits, no rebound, no guarding, no midline pulsatile mass, no hepatomegaly, no splenomegaly EXT:  1+ DP/TP.   No edema, no cyanosis no clubbing SKIN:  No rashes no nodules NEURO:  Cranial nerves II through XII grossly intact, motor grossly intact throughout PSYCH:  Cognitively intact, oriented to person place and time   ASSESSMENT/PLAN:    Essential hypertension Blood pressure is elevated.  At home.  We will increase amlodipine to 5 mg.  We will start taking it at night, as his blood pressure in the morning seems to be most elevated before his morning dose of medicine.  Given that he does have frequent nocturia, it is reasonable to consider doxazosin and avoid diuretics at this time.  His blood pressure remains elevated would either increase amlodipine or add an ARB.  He will keep working to try to increase his exercise to at least 150 minutes weekly.  He is also interested in smoking cessation.  He will consider using nicotine gum and was instructed on how to properly use it.  Encouraged CPAP use, but he has not convinced that it affects his blood pressure.  He is interested in enrolling in our remote patient monitoring study and consents to monitoring through the  [employer] remote patient monitoring system.  Critical lower limb ischemia S/p bilateral iliac stents.  He has no claudication.  Lipids are nearly at goal on atorvastatin.  Consider increasing to 20mg .  Continue clopidogrel.   RBBB Noted on EKG 01/2020 and now.  Tobacco abuse He will start using nicotine replacement gum.02/2020  Currently smoking 1 pack of cigarettes daily.   Screening for Secondary Hypertension:  Causes 04/12/2021  Drugs/Herbals Screened     - Comments Limits sodium intake.  Limited caffeine.  EtOH 1-2/night. 1ppd    Relevant Labs/Studies: Basic Labs Latest Ref Rng & Units 02/09/2021 01/28/2020 12/09/2019  Sodium 135 - 146 mmol/L 139 138 135  Potassium 3.5 - 5.3 mmol/L 4.5 4.1 5.5(H)  Creatinine 0.70 - 1.28 mg/dL 0.48 8.89 1.69    Thyroid  Latest Ref Rng & Units 12/09/2019  TSH 0.40 - 4.50 mIU/L 1.53                 Disposition:    FU with MD/PharmD in 1 months FU with Kanasia Gayman C. Duke Salvia, MD, [facility] in 4 months    Medication Adjustments/Labs and Tests Ordered: Current medicines are reviewed at length with the patient today.  Concerns regarding medicines are outlined above.  Orders Placed This Encounter  Procedures   TSH   CBC with Differential/Platelet   EKG 12-Lead    Meds ordered this encounter  Medications   DISCONTD: amLODipine (NORVASC) 5 MG tablet    Sig: Take 1 tablet (5 mg total) by mouth daily.    Dispense:  90 tablet    Refill:  3    NEW DOSE, D/C 2.5 MG RX   amLODipine (NORVASC) 5 MG tablet    Sig: Take 1 tablet (5 mg total) by mouth at bedtime.    Dispense:  90 tablet    Refill:  3    NEW DOSE, D/C 2.5 MG RX    I,Mykaella Javier,acting as a scribe for Chilton Si, MD.,have documented all relevant documentation on the behalf of Chilton Si, MD,as directed by  Chilton Si, MD while in the presence of Chilton Si, MD.  I, Charlott Calvario C. Duke Salvia, MD have reviewed all documentation for this visit.  The documentation of the exam,  diagnosis, procedures, and orders on 04/12/2021 are all accurate and complete.   Signed, Chilton Si, MD  04/12/2021 1:19 PM    [facility]

## 2021-04-12 ENCOUNTER — Other Ambulatory Visit: Payer: Self-pay

## 2021-04-12 ENCOUNTER — Ambulatory Visit (INDEPENDENT_AMBULATORY_CARE_PROVIDER_SITE_OTHER): Payer: Medicare Other | Admitting: Cardiovascular Disease

## 2021-04-12 ENCOUNTER — Encounter: Payer: Self-pay | Admitting: *Deleted

## 2021-04-12 ENCOUNTER — Encounter (HOSPITAL_BASED_OUTPATIENT_CLINIC_OR_DEPARTMENT_OTHER): Payer: Self-pay | Admitting: Cardiovascular Disease

## 2021-04-12 DIAGNOSIS — Z720 Tobacco use: Secondary | ICD-10-CM | POA: Diagnosis not present

## 2021-04-12 DIAGNOSIS — I70229 Atherosclerosis of native arteries of extremities with rest pain, unspecified extremity: Secondary | ICD-10-CM | POA: Diagnosis not present

## 2021-04-12 DIAGNOSIS — Z006 Encounter for examination for normal comparison and control in clinical research program: Secondary | ICD-10-CM

## 2021-04-12 DIAGNOSIS — I4510 Unspecified right bundle-branch block: Secondary | ICD-10-CM

## 2021-04-12 DIAGNOSIS — I10 Essential (primary) hypertension: Secondary | ICD-10-CM | POA: Diagnosis not present

## 2021-04-12 HISTORY — DX: Tobacco use: Z72.0

## 2021-04-12 HISTORY — DX: Unspecified right bundle-branch block: I45.10

## 2021-04-12 MED ORDER — AMLODIPINE BESYLATE 5 MG PO TABS: 5.0000 mg | ORAL_TABLET | Freq: Every day | ORAL | 3 refills | Status: DC

## 2021-04-12 NOTE — Research (Signed)
Mr. Jason Carson met criteria for the Virtual care and social determinant interventions for the management of hypertension trial. The trial was discussed with him by Dr. Oval Linsey and myself including risk/benefits. He had the opportunity to read the consent and ask questions. The consent was signed and a copy was given to Mr. Jason Carson. [name] was randomized to Group 1: Advanced Hypertension clinic alone.

## 2021-04-12 NOTE — Assessment & Plan Note (Signed)
Noted on EKG 01/2020 and now.

## 2021-04-12 NOTE — Patient Instructions (Addendum)
Medication Instructions:  INCREASE YOUR AMLODIPINE TO 5 MG DAILY AT BEDTIME   TRY NICORETTE GUM TO HELP YOU QUIT SMOKING   STOP PHENYLEPHRINE NASAL SPRAY    Labwork: CBC/TSH SOON    Testing/Procedures: NONE    Follow-Up: 05/16/2021 11:00 AM WITH PHARM D AT [address]   Special Instructions:   MONITOR YOUR BLOOD PRESSURE WITH MACHINE GIVEN TWICE A DAY, LOG IN THE BOOK PROVIDED. BRING THE BOOK AND YOUR BLOOD PRESSURE MACHINE TO YOUR FOLLOW UP IN 1 MONTH    DASH Eating Plan DASH stands for "Dietary Approaches to Stop Hypertension." The DASH eating plan is a healthy eating plan that has been shown to reduce high blood pressure (hypertension). It may also reduce your risk for type 2 diabetes, heart disease, and stroke. The DASH eating plan may also help with weight loss. What are tips for following this plan?  General guidelines Avoid eating more than 2,300 mg (milligrams) of salt (sodium) a day. If you have hypertension, you may need to reduce your sodium intake to 1,500 mg a day. Limit alcohol intake to no more than 1 drink a day for nonpregnant women and 2 drinks a day for men. One drink equals 12 oz of beer, 5 oz of wine, or 1 oz of hard liquor. Work with your health care provider to maintain a healthy body weight or to lose weight. Ask what an ideal weight is for you. Get at least 30 minutes of exercise that causes your heart to beat faster (aerobic exercise) most days of the week. Activities may include walking, swimming, or biking. Work with your health care provider or diet and nutrition specialist (dietitian) to adjust your eating plan to your individual calorie needs. Reading food labels  Check food labels for the amount of sodium per serving. Choose foods with less than 5 percent of the Daily Value of sodium. Generally, foods with less than 300 mg of sodium per serving fit into this eating plan. To find whole grains, look for the word "whole" as the first  word in the ingredient list. Shopping Buy products labeled as "low-sodium" or "no salt added." Buy fresh foods. Avoid canned foods and premade or frozen meals. Cooking Avoid adding salt when cooking. Use salt-free seasonings or herbs instead of table salt or sea salt. Check with your health care provider or pharmacist before using salt substitutes. Do not fry foods. Cook foods using healthy methods such as baking, boiling, grilling, and broiling instead. Cook with heart-healthy oils, such as olive, canola, soybean, or sunflower oil. Meal planning Eat a balanced diet that includes: 5 or more servings of fruits and vegetables each day. At each meal, try to fill half of your plate with fruits and vegetables. Up to 6-8 servings of whole grains each day. Less than 6 oz of lean meat, poultry, or fish each day. A 3-oz serving of meat is about the same size as a deck of cards. One egg equals 1 oz. 2 servings of low-fat dairy each day. A serving of nuts, seeds, or beans 5 times each week. Heart-healthy fats. Healthy fats called Omega-3 fatty acids are found in foods such as flaxseeds and coldwater fish, like sardines, salmon, and mackerel. Limit how much you eat of the following: Canned or prepackaged foods. Food that is high in trans fat, such as fried foods. Food that is high in saturated fat, such as fatty meat. Sweets, desserts, sugary drinks, and other foods with added sugar. Full-fat dairy  products. Do not salt foods before eating. Try to eat at least 2 vegetarian meals each week. Eat more home-cooked food and less restaurant, buffet, and fast food. When eating at a restaurant, ask that your food be prepared with less salt or no salt, if possible. What foods are recommended? The items listed may not be a complete list. Talk with your dietitian about what dietary choices are best for you. Grains Whole-grain or whole-wheat bread. Whole-grain or whole-wheat pasta. Brown rice. Orpah Cobb.  Bulgur. Whole-grain and low-sodium cereals. Pita bread. Low-fat, low-sodium crackers. Whole-wheat flour tortillas. Vegetables Fresh or frozen vegetables (raw, steamed, roasted, or grilled). Low-sodium or reduced-sodium tomato and vegetable juice. Low-sodium or reduced-sodium tomato sauce and tomato paste. Low-sodium or reduced-sodium canned vegetables. Fruits All fresh, dried, or frozen fruit. Canned fruit in natural juice (without added sugar). Meat and other protein foods Skinless chicken or [place]. Ground chicken or [place]. Pork with fat trimmed off. Fish and seafood. Egg whites. Dried beans, peas, or lentils. Unsalted nuts, nut butters, and seeds. Unsalted canned beans. Lean cuts of beef with fat trimmed off. Low-sodium, lean deli meat. Dairy Low-fat (1%) or fat-free (skim) milk. Fat-free, low-fat, or reduced-fat cheeses. Nonfat, low-sodium ricotta or cottage cheese. Low-fat or nonfat yogurt. Low-fat, low-sodium cheese. Fats and oils Soft margarine without trans fats. Vegetable oil. Low-fat, reduced-fat, or light mayonnaise and salad dressings (reduced-sodium). Canola, safflower, olive, soybean, and sunflower oils. Avocado. Seasoning and other foods Herbs. Spices. Seasoning mixes without salt. Unsalted popcorn and pretzels. Fat-free sweets. What foods are not recommended? The items listed may not be a complete list. Talk with your dietitian about what dietary choices are best for you. Grains Baked goods made with fat, such as croissants, muffins, or some breads. Dry pasta or rice meal packs. Vegetables Creamed or fried vegetables. Vegetables in a cheese sauce. Regular canned vegetables (not low-sodium or reduced-sodium). Regular canned tomato sauce and paste (not low-sodium or reduced-sodium). Regular tomato and vegetable juice (not low-sodium or reduced-sodium). Rosita Fire. Olives. Fruits Canned fruit in a light or heavy syrup. Fried fruit. Fruit in cream or butter sauce. Meat and other  protein foods Fatty cuts of meat. Ribs. Fried meat. Tomasa Blase. Sausage. Bologna and other processed lunch meats. Salami. Fatback. Hotdogs. Bratwurst. Salted nuts and seeds. Canned beans with added salt. Canned or smoked fish. Whole eggs or egg yolks. Chicken or [place] with skin. Dairy Whole or 2% milk, cream, and half-and-half. Whole or full-fat cream cheese. Whole-fat or sweetened yogurt. Full-fat cheese. Nondairy creamers. Whipped toppings. Processed cheese and cheese spreads. Fats and oils Butter. Stick margarine. Lard. Shortening. Ghee. Bacon fat. Tropical oils, such as coconut, palm kernel, or palm oil. Seasoning and other foods Salted popcorn and pretzels. Onion salt, garlic salt, seasoned salt, table salt, and sea salt. Worcestershire sauce. Tartar sauce. Barbecue sauce. Teriyaki sauce. Soy sauce, including reduced-sodium. Steak sauce. Canned and packaged gravies. Fish sauce. Oyster sauce. Cocktail sauce. Horseradish that you find on the shelf. Ketchup. Mustard. Meat flavorings and tenderizers. Bouillon cubes. Hot sauce and Tabasco sauce. Premade or packaged marinades. Premade or packaged taco seasonings. Relishes. Regular salad dressings. Where to find more information: National Heart, Lung, and Blood Institute: PopSteam.is American Heart Association: www.heart.org Summary The DASH eating plan is a healthy eating plan that has been shown to reduce high blood pressure (hypertension). It may also reduce your risk for type 2 diabetes, heart disease, and stroke. With the DASH eating plan, you should limit salt (sodium) intake to 2,300 mg a day.  If you have hypertension, you may need to reduce your sodium intake to 1,500 mg a day. When on the DASH eating plan, aim to eat more fresh fruits and vegetables, whole grains, lean proteins, low-fat dairy, and heart-healthy fats. Work with your health care provider or diet and nutrition specialist (dietitian) to adjust your eating plan to your individual  calorie needs. This information is not intended to replace advice given to you by your health care provider. Make sure you discuss any questions you have with your health care provider. Document Released: 05/04/2011 Document Revised: 04/27/2017 Document Reviewed: 05/08/2016 Elsevier Patient Education  2020 [employer].

## 2021-04-12 NOTE — Assessment & Plan Note (Signed)
He will start using nicotine replacement gum..  Currently smoking 1 pack of cigarettes daily.

## 2021-04-12 NOTE — Assessment & Plan Note (Addendum)
Blood pressure is elevated.  At home.  We will increase amlodipine to 5 mg.  We will start taking it at night, as his blood pressure in the morning seems to be most elevated before his morning dose of medicine.  Given that he does have frequent nocturia, it is reasonable to consider doxazosin and avoid diuretics at this time.  His blood pressure remains elevated would either increase amlodipine or add an ARB.  He will keep working to try to increase his exercise to at least 150 minutes weekly.  He is also interested in smoking cessation.  He will consider using nicotine gum and was instructed on how to properly use it.  Encouraged CPAP use, but he has not convinced that it affects his blood pressure.  He is interested in enrolling in our remote patient monitoring study and consents to monitoring through the [employer] remote patient monitoring system.

## 2021-04-12 NOTE — Assessment & Plan Note (Addendum)
S/p bilateral iliac stents.  He has no claudication.  Lipids are nearly at goal on atorvastatin.  Consider increasing to 20mg .  Continue clopidogrel.

## 2021-05-16 ENCOUNTER — Encounter: Payer: Self-pay | Admitting: Pharmacist Clinician (PhC)/ Clinical Pharmacy Specialist

## 2021-05-16 ENCOUNTER — Ambulatory Visit (INDEPENDENT_AMBULATORY_CARE_PROVIDER_SITE_OTHER): Payer: Medicare Other | Admitting: Pharmacist Clinician (PhC)/ Clinical Pharmacy Specialist

## 2021-05-16 ENCOUNTER — Other Ambulatory Visit: Payer: Self-pay

## 2021-05-16 DIAGNOSIS — I10 Essential (primary) hypertension: Secondary | ICD-10-CM

## 2021-05-16 NOTE — Progress Notes (Signed)
05/16/2021 Jason Carson Jul 18, 1950 [id number]   HPI:  Jason Carson is a 70 y.o. male patient of Dr Duke Salvia, with a PMH below who presents today for advanced hypertension clinic follow up.  When she saw him last month in the office his pressure was 178/74.  Patient noted that his home BP readings tend to be much higher in the mornings (150-170's) and then improves as the day goes on (100-103's).  At that appointment she increased his amlodipine to 5 mg and had him move it to evenings.  Patient believes that there is also a measure of white coat hypertension.  He was agreeable to our research project and randomized to group 1, with monthly visits.    Today he is in the office for his first follow up visit.  Has been checking home BP readings up to 4 times daily, and still notes that morning readings are higher than later in the day.  It was recommended that he start nicotine gum to help with smoking cessation, but he has concerns about being safe with his dental work (has multiple implants, only 3 original teeth) and has not started that yet.  The cuff he was given thru the research study is reading higher than his previous home cuff, and read significantly higher than the in-office reading.    Past Medical History: PAD Stents to R and L external iliacs  OSA Not consistently using CPAP  Tobacco abuse Started Nicorette gum 11/22     Blood Pressure Goal:  130/80  Current Medications: amlodipine 5 mg qhs, doxazosin 8 mg qhs  Family Hx: dad died at 67, mom at 22; 1 sister, not known if she has any heart disease, no children  Social Hx: current smoker  Diet: mix of home, some take out; no added salt, is very sodium conscious when cooking/eating; good mix of meats/vegetables   Exercise: no formal exercise, but does chores/maintenance work around house ("[place] out [place] the woods")  Home BP readings:    AM - 33 readings average 140/81 (range 110-162/66-94)  PM - 32 readings average  119/66 (range 99-132/57-79)  Intolerances: nkda  Labs: 9/22:  Na 139, K 4.5,Glu 92, BUN 12, SCr 0.87, GFR 93   Wt Readings from Last 3 Encounters:  05/16/21 190 lb 3.2 oz (86.3 kg)  04/12/21 193 lb (87.5 kg)  02/09/21 186 lb (84.4 kg)   BP Readings from Last 3 Encounters:  05/16/21 139/88  04/12/21 (!) 178/74  02/09/21 135/77   Pulse Readings from Last 3 Encounters:  05/16/21 94  04/12/21 71  02/09/21 78    Current Outpatient Medications  Medication Sig Dispense Refill   AMBULATORY NON FORMULARY MEDICATION Continuous positive airway pressure (CPAP) machine set on AutoPAP (4-20 cmH2O), with all supplemental supplies as needed. 1 each 0   amLODipine (NORVASC) 5 MG tablet Take 1 tablet (5 mg total) by mouth at bedtime. 90 tablet 3   Aspirin 81 MG CAPS Take 81 mg by mouth daily.     atorvastatin (LIPITOR) 10 MG tablet TAKE 1 TABLET BY MOUTH EVERY DAY 90 tablet 2   clopidogrel (PLAVIX) 75 MG tablet TAKE 1 TABLET BY MOUTH EVERY DAY 90 tablet 2   doxazosin (CARDURA) 8 MG tablet Take 1 tablet (8 mg total) by mouth at bedtime. 90 tablet 0   Multiple Vitamin (MULTIVITAMIN WITH MINERALS) TABS tablet Take 1 tablet by mouth daily.     No current facility-administered medications for this visit.    No  Known Allergies  Past Medical History:  Diagnosis Date   Atherosclerosis    Benign essential tremor    RBBB 04/12/2021   Tobacco abuse 04/12/2021    Blood pressure 139/88, pulse 94, resp. rate 15, height 6' (1.829 m), weight 190 lb 3.2 oz (86.3 kg), SpO2 96 %.  Essential hypertension Patient with essential hypertension.  Home readings indicate good control in the evenings, but averaging 20 points higher in the mornings.  Interestingly, the [place] cuff he was given for research read almost 30 points higher in the office this morning. (even after multiple uses throughout visit).  Because of that, I'm not sure what to do with home reading information.  His evening readings are  certainly not 30 points higher, as he is most likely not running systolic < 100.  Also, he takes all meds in the evenings, so would expect some AM benefit from this, as opposed to evening benefit.  Does not smoke before taking morning readings.    For now, will have him increase the amlodipine to 10 mg daily.  He should continue to take in the evenings.  Advised that if he becomes symptomatically hypotensive he should cut back to the 5 mg dose.  We will see him back in 1 month for follow up.    Phillips Hay PharmD CPP [facility] [address] [place], [place] [address] [phone]

## 2021-05-16 NOTE — Assessment & Plan Note (Signed)
Patient with essential hypertension.  Home readings indicate good control in the evenings, but averaging 20 points higher in the mornings.  Interestingly, the [place] cuff he was given for research read almost 30 points higher in the office this morning. (even after multiple uses throughout visit).  Because of that, I'm not sure what to do with home reading information.  His evening readings are certainly not 30 points higher, as he is most likely not running systolic < 100.  Also, he takes all meds in the evenings, so would expect some AM benefit from this, as opposed to evening benefit.  Does not smoke before taking morning readings.    For now, will have him increase the amlodipine to 10 mg daily.  He should continue to take in the evenings.  Advised that if he becomes symptomatically hypotensive he should cut back to the 5 mg dose.  We will see him back in 1 month for follow up.

## 2021-05-16 NOTE — Patient Instructions (Signed)
Return for a a follow up appointment January 23 at 11 am  Check your blood pressure at home daily and keep record of the readings.  Take your BP meds as follows:  Increase amlodipine to 10 mg each evening.  Continue with all other medications.   Bring all of your meds, your BP cuff and your record of home blood pressures to your next appointment.  Exercise as youre able, try to walk approximately 30 minutes per day.  Keep salt intake to a minimum, especially watch canned and prepared boxed foods.  Eat more fresh fruits and vegetables and fewer canned items.  Avoid eating in fast food restaurants.    HOW TO TAKE YOUR BLOOD PRESSURE: Rest 5 minutes before taking your blood pressure.  Dont smoke or drink caffeinated beverages for at least 30 minutes before. Take your blood pressure before (not after) you eat. Sit comfortably with your back supported and both feet on the floor (dont cross your legs). Elevate your arm to heart level on a table or a desk. Use the proper sized cuff. It should fit smoothly and snugly around your bare upper arm. There should be enough room to slip a fingertip under the cuff. The bottom edge of the cuff should be 1 inch above the crease of the elbow. Ideally, take 3 measurements at one sitting and record the average.

## 2021-06-05 ENCOUNTER — Other Ambulatory Visit: Payer: Self-pay | Admitting: Medical-Surgical

## 2021-06-20 ENCOUNTER — Ambulatory Visit (INDEPENDENT_AMBULATORY_CARE_PROVIDER_SITE_OTHER): Payer: Medicare Other | Admitting: Pharmacist Clinician (PhC)/ Clinical Pharmacy Specialist

## 2021-06-20 ENCOUNTER — Other Ambulatory Visit: Payer: Self-pay

## 2021-06-20 VITALS — BP 135/80 | HR 85 | Resp 14 | Ht 72.0 in | Wt 188.4 lb

## 2021-06-20 DIAGNOSIS — I10 Essential (primary) hypertension: Secondary | ICD-10-CM

## 2021-06-20 MED ORDER — AMLODIPINE BESYLATE 5 MG PO TABS: 5.0000 mg | ORAL_TABLET | Freq: Two times a day (BID) | ORAL | 3 refills | Status: DC

## 2021-06-20 MED ORDER — VALSARTAN 80 MG PO TABS: 80.0000 mg | ORAL_TABLET | Freq: Every day | ORAL | 6 refills | Status: DC

## 2021-06-20 NOTE — Progress Notes (Signed)
06/21/2021 Jason Carson 14-Dec-1950 [id number]   HPI:  Jason Carson is a 71 y.o. male patient of Dr Duke Salvia, with a PMH below who presents today for advanced hypertension clinic follow up.  When she saw him last month in the office his pressure was 178/74.  Patient noted that his home BP readings tend to be much higher in the mornings (150-170's) and then improves as the day goes on (100-103's).  At that appointment she increased his amlodipine to 5 mg and had him move it to evenings.  Patient believes that there is also a measure of white coat hypertension.  He was agreeable to our research project and randomized to group 1, with monthly visits.  At his first follow up it was noted that his research cuff was reading almost 30 points higher than office readings.  Amlodipine was increased to 10 mg daily.  Today he is in the office for his second follow up visit.  He has been checking his BP with 2 different devices since our last visit, as he is not sure about the accuracy of either.  Today all 3 devices read within 10 points, although his were both higher than the office reading again.     Past Medical History: PAD Stents to R and L external iliacs  OSA Not consistently using CPAP  Tobacco abuse Started Nicorette gum 11/22     Blood Pressure Goal:  130/80  Current Medications: amlodipine 10 mg qhs, doxazosin 8 mg qhs  Family Hx: dad died at 78, mom at 67; 1 sister, not known if she has any heart disease, no children  Social Hx: current smoker  Diet: mix of home, some take out; no added salt, is very sodium conscious when cooking/eating; good mix of meats/vegetables   Exercise: no formal exercise, but does chores/maintenance work around house ("lives out in the woods")  Home BP readings:    AM average  144/82 (study cuff) 132/79 (home cuff); previous average 140/81  PM average  113/91 (study cuff)  104/59 (home cuff); previous average 119/66  Intolerances: nkda  Labs:  9/22:  Na 139, K 4.5,Glu 92, BUN 12, SCr 0.87, GFR 93   Wt Readings from Last 3 Encounters:  06/20/21 188 lb 6.4 oz (85.5 kg)  05/16/21 190 lb 3.2 oz (86.3 kg)  04/12/21 193 lb (87.5 kg)   BP Readings from Last 3 Encounters:  06/20/21 135/80  05/16/21 139/88  04/12/21 (!) 178/74   Pulse Readings from Last 3 Encounters:  06/20/21 85  05/16/21 94  04/12/21 71    Current Outpatient Medications  Medication Sig Dispense Refill   AMBULATORY NON FORMULARY MEDICATION Continuous positive airway pressure (CPAP) machine set on AutoPAP (4-20 cmH2O), with all supplemental supplies as needed. 1 each 0   amLODipine (NORVASC) 5 MG tablet Take 1 tablet (5 mg total) by mouth in the morning and at bedtime. 180 tablet 3   Aspirin 81 MG CAPS Take 81 mg by mouth daily.     atorvastatin (LIPITOR) 10 MG tablet TAKE 1 TABLET BY MOUTH EVERY DAY 90 tablet 2   clopidogrel (PLAVIX) 75 MG tablet TAKE 1 TABLET BY MOUTH EVERY DAY 90 tablet 2   doxazosin (CARDURA) 8 MG tablet TAKE 1 TABLET BY MOUTH AT BEDTIME. 90 tablet 0   Multiple Vitamin (MULTIVITAMIN WITH MINERALS) TABS tablet Take 1 tablet by mouth daily.     valsartan (DIOVAN) 80 MG tablet Take 1 tablet (80 mg total) by mouth daily. 30  tablet 6   No current facility-administered medications for this visit.    No Known Allergies  Past Medical History:  Diagnosis Date   Atherosclerosis    Benign essential tremor    RBBB 04/12/2021   Tobacco abuse 04/12/2021    Blood pressure 135/80, pulse 85, resp. rate 14, height 6' (1.829 m), weight 188 lb 6.4 oz (85.5 kg), SpO2 100 %.  Essential hypertension Patient with essential hypertension, currently not at goal BP (with any readings).  Had a long discussion about which home cuff technique, and suggested he needs to still have 5 minutes of "rest" after putting on second cuff if we are to compare 2 home readings on different devices.   Regardless, will have him add valsartan 80 mg daily and continue with  amlodipine and doxazosin.  He will need to get metabolic panel in 2 weeks and we will see him for his third research visit in one month.     Phillips Hay PharmD CPP [facility] [address] [place], [place] [address] [phone]

## 2021-06-20 NOTE — Patient Instructions (Signed)
Return for a a follow up appointment February 22 at 11 am  Go to the lab in 2 weeks to check kidney function  Take your BP meds as follows:  AM: valsartan 80 mg, amlodipine 5 mg  PM: amlodipine 5 mg, doxazosin 8 mg  Bring all of your meds, your BP cuff and your record of home blood pressures to your next appointment.  Exercise as youre able, try to walk approximately 30 minutes per day.  Keep salt intake to a minimum, especially watch canned and prepared boxed foods.  Eat more fresh fruits and vegetables and fewer canned items.  Avoid eating in fast food restaurants.    HOW TO TAKE YOUR BLOOD PRESSURE: Rest 5 minutes before taking your blood pressure.  Dont smoke or drink caffeinated beverages for at least 30 minutes before. Take your blood pressure before (not after) you eat. Sit comfortably with your back supported and both feet on the floor (dont cross your legs). Elevate your arm to heart level on a table or a desk. Use the proper sized cuff. It should fit smoothly and snugly around your bare upper arm. There should be enough room to slip a fingertip under the cuff. The bottom edge of the cuff should be 1 inch above the crease of the elbow. Ideally, take 3 measurements at one sitting and record the average.

## 2021-06-21 ENCOUNTER — Encounter: Payer: Self-pay | Admitting: Pharmacist Clinician (PhC)/ Clinical Pharmacy Specialist

## 2021-06-21 NOTE — Assessment & Plan Note (Signed)
Patient with essential hypertension, currently not at goal BP (with any readings).  Had a long discussion about which home cuff technique, and suggested he needs to still have 5 minutes of "rest" after putting on second cuff if we are to compare 2 home readings on different devices.   Regardless, will have him add valsartan 80 mg daily and continue with amlodipine and doxazosin.  He will need to get metabolic panel in 2 weeks and we will see him for his third research visit in one month.

## 2021-06-25 ENCOUNTER — Other Ambulatory Visit: Payer: Self-pay | Admitting: Medical-Surgical

## 2021-06-25 DIAGNOSIS — I10 Essential (primary) hypertension: Secondary | ICD-10-CM

## 2021-07-13 NOTE — Telephone Encounter (Signed)
Patient seen in office 11/15

## 2021-07-15 ENCOUNTER — Telehealth: Payer: Self-pay | Admitting: Pharmacist

## 2021-07-15 DIAGNOSIS — I10 Essential (primary) hypertension: Secondary | ICD-10-CM

## 2021-07-15 NOTE — Telephone Encounter (Signed)
Patient calls to request lab orders be sent to [facility]

## 2021-07-16 LAB — CBC WITH DIFFERENTIAL/PLATELET
Absolute Monocytes: 938 cells/uL (ref 200–950)
Basophils Absolute: 20 cells/uL (ref 0–200)
Basophils Relative: 0.3 %
Eosinophils Absolute: 68 cells/uL (ref 15–500)
Eosinophils Relative: 1 %
HCT: 40 % (ref 38.5–50.0)
Hemoglobin: 14.1 g/dL (ref 13.2–17.1)
Lymphs Abs: 1802 cells/uL (ref 850–3900)
MCH: 35.9 pg — ABNORMAL HIGH (ref 27.0–33.0)
MCHC: 35.3 g/dL (ref 32.0–36.0)
MCV: 101.8 fL — ABNORMAL HIGH (ref 80.0–100.0)
MPV: 10.4 fL (ref 7.5–12.5)
Monocytes Relative: 13.8 %
Neutro Abs: 3971 cells/uL (ref 1500–7800)
Neutrophils Relative %: 58.4 %
Platelets: 219 10*3/uL (ref 140–400)
RBC: 3.93 10*6/uL — ABNORMAL LOW (ref 4.20–5.80)
RDW: 11.5 % (ref 11.0–15.0)
Total Lymphocyte: 26.5 %
WBC: 6.8 10*3/uL (ref 3.8–10.8)

## 2021-07-16 LAB — BASIC METABOLIC PANEL WITH GFR
BUN: 14 mg/dL (ref 7–25)
CO2: 27 mmol/L (ref 20–32)
Calcium: 8.8 mg/dL (ref 8.6–10.3)
Chloride: 100 mmol/L (ref 98–110)
Creat: 0.85 mg/dL (ref 0.70–1.28)
Glucose, Bld: 97 mg/dL (ref 65–99)
Potassium: 4.1 mmol/L (ref 3.5–5.3)
Sodium: 134 mmol/L — ABNORMAL LOW (ref 135–146)
eGFR: 93 mL/min/{1.73_m2} (ref >=60)

## 2021-07-20 ENCOUNTER — Other Ambulatory Visit: Payer: Self-pay

## 2021-07-20 ENCOUNTER — Ambulatory Visit (INDEPENDENT_AMBULATORY_CARE_PROVIDER_SITE_OTHER): Payer: Medicare Other | Admitting: Pharmacist

## 2021-07-20 VITALS — BP 149/83 | HR 83 | Resp 15 | Ht 72.0 in | Wt 193.0 lb

## 2021-07-20 DIAGNOSIS — I10 Essential (primary) hypertension: Secondary | ICD-10-CM | POA: Diagnosis not present

## 2021-07-20 DIAGNOSIS — I2510 Atherosclerotic heart disease of native coronary artery without angina pectoris: Secondary | ICD-10-CM | POA: Diagnosis not present

## 2021-07-20 NOTE — Patient Instructions (Addendum)
It was nice meeting you today  We would like your blood pressure to be less than 130/80.  While it is above that in the room today, your home blood pressure readings are much better  Therefore we will keep the same regimen because I am worried your blood pressure may drop too suddenly  Continue your :  Amlodipine 5 mg twice daily Valsartan 80mg  daily Doxazosin 8mg  daily  If you are interested in starting the nicotine lozenges, we recommend starting at the 4mg  dose  Please continue to monitor your blood pressure at home and bring your readings to your appointment with Dr next month  Please call with any questions  , PharmD, BCACP, CDCES, CPP [address], [address] [place], [place], [address] Phone: [phone], Fax: [phone]

## 2021-07-20 NOTE — Progress Notes (Signed)
Patient ID: Matheu Ploeger                 DOB: July 28, 1950                      MRN: [id number]     HPI: Leotha Voeltz is a 71 y.o. male referred by Dr. Duke Salvia to HTN clinic. PMH is significant for HTN, RBBB, OSA, and tobacco use. This is patients third visit to HTN clinic.  At last visit with PharmD, amlodpine was changed to 5mg  BID due to patient consistently having elevated morning BP readings.  Has had no adverse effects to starting valsartan 1 month ago.  Patient presents today in good spirits. Continues to smoke ~1ppd, has not picked up nicotine gum that was recommended previously.  Brought BP log: Average from 1/22-2/22: 124/67 Average from 12/18-1/23: 129/72 Average from 11/15-12/18: 131/74  Blood pressure has steadily decreased over last 3 months however patient has had hypotensive readings in past month.  1/29: 78/40 2/7: 85/49 2/12: 88/48  Patient was asymptomatic for these episodes.  Current HTN meds:  Amlodipine 5 mg BID Valsartan 80mg  daily Doxazosin 8mg  daily   Wt Readings from Last 3 Encounters:  06/20/21 188 lb 6.4 oz (85.5 kg)  05/16/21 190 lb 3.2 oz (86.3 kg)  04/12/21 193 lb (87.5 kg)   BP Readings from Last 3 Encounters:  06/20/21 135/80  05/16/21 139/88  04/12/21 (!) 178/74   Pulse Readings from Last 3 Encounters:  06/20/21 85  05/16/21 94  04/12/21 71    Renal function: CrCl cannot be calculated (Unknown ideal weight.).  Past Medical History:  Diagnosis Date   Atherosclerosis    Benign essential tremor    RBBB 04/12/2021   Tobacco abuse 04/12/2021    Current Outpatient Medications on File Prior to Visit  Medication Sig Dispense Refill   AMBULATORY NON FORMULARY MEDICATION Continuous positive airway pressure (CPAP) machine set on AutoPAP (4-20 cmH2O), with all supplemental supplies as needed. 1 each 0   amLODipine (NORVASC) 5 MG tablet Take 1 tablet (5 mg total) by mouth in the morning and at bedtime. 180 tablet 3   Aspirin 81 MG  CAPS Take 81 mg by mouth daily.     atorvastatin (LIPITOR) 10 MG tablet TAKE 1 TABLET BY MOUTH EVERY DAY 90 tablet 2   clopidogrel (PLAVIX) 75 MG tablet TAKE 1 TABLET BY MOUTH EVERY DAY 90 tablet 2   doxazosin (CARDURA) 8 MG tablet TAKE 1 TABLET BY MOUTH AT BEDTIME. 90 tablet 0   Multiple Vitamin (MULTIVITAMIN WITH MINERALS) TABS tablet Take 1 tablet by mouth daily.     valsartan (DIOVAN) 80 MG tablet Take 1 tablet (80 mg total) by mouth daily. 30 tablet 6   No current facility-administered medications on file prior to visit.    No Known Allergies   Assessment/Plan:  1. Hypertension -  Patient BP in room 149/83 which is above goal of <130/80 however patient routinely has elevated readings in medical offices.  Blood pressure has decreased to an average of 124/67 which is at goal.  Concern regarding occasional hypotensive readings however patient was asymptomatic.  Due to patient being at goal at home and concern for more frequent hypotension, will not increase medications at this time.  Has f/u with Dr 04/14/21 in 1 month.  Again encouraged nicotine cessation.  Since patient was hesitant to start nicotine gum due to his dental issues, advised he could try OTC nicotine lozenges. Recommended one 4mg   lozenge every 1 to 2 hours as needed.  Patient voiced understanding.  Continue: Amlodipine 5 mg BID Valsartan 80mg  daily Doxazosin 8mg  daily Recheck with Dr in 4 weeks  , PharmD, BCACP, CDCES, CPP [address], [address] [place], [address], [place] Phone: [phone], Fax: [phone]

## 2021-08-17 ENCOUNTER — Other Ambulatory Visit: Payer: Self-pay

## 2021-08-17 ENCOUNTER — Encounter (HOSPITAL_BASED_OUTPATIENT_CLINIC_OR_DEPARTMENT_OTHER): Payer: Self-pay | Admitting: Cardiovascular Disease

## 2021-08-17 ENCOUNTER — Ambulatory Visit (INDEPENDENT_AMBULATORY_CARE_PROVIDER_SITE_OTHER): Payer: Medicare Other | Admitting: Cardiovascular Disease

## 2021-08-17 VITALS — BP 123/66 | HR 80 | Ht 72.0 in | Wt 191.7 lb

## 2021-08-17 DIAGNOSIS — Z006 Encounter for examination for normal comparison and control in clinical research program: Secondary | ICD-10-CM

## 2021-08-17 DIAGNOSIS — E7800 Pure hypercholesterolemia, unspecified: Secondary | ICD-10-CM | POA: Diagnosis not present

## 2021-08-17 DIAGNOSIS — I2510 Atherosclerotic heart disease of native coronary artery without angina pectoris: Secondary | ICD-10-CM | POA: Insufficient documentation

## 2021-08-17 DIAGNOSIS — Z720 Tobacco use: Secondary | ICD-10-CM | POA: Diagnosis not present

## 2021-08-17 DIAGNOSIS — R0609 Other forms of dyspnea: Secondary | ICD-10-CM

## 2021-08-17 DIAGNOSIS — I10 Essential (primary) hypertension: Secondary | ICD-10-CM

## 2021-08-17 HISTORY — DX: Atherosclerotic heart disease of native coronary artery without angina pectoris: I25.10

## 2021-08-17 HISTORY — DX: Pure hypercholesterolemia, unspecified: E78.00

## 2021-08-17 NOTE — Assessment & Plan Note (Signed)
BP average is 113/64 on his [facility] machine and 123/65 on the [employer] [facility].  It is still 130-140s/70s in the am and 90s-100s/50 in the afternoon.  We will get him to start taking the valsartan at night.  Continue amlodipine 5mg  bid and doxazosin 8mg  qhs.  BP at home is lower than in the office.  He has white coat hypertension superimposed on essential hypertension.   ?

## 2021-08-17 NOTE — Assessment & Plan Note (Signed)
Coronary calcification noted on chest CT.  He has exertional dyspnea.  He is never had any ischemic evaluation and he has hypertension, hyperlipidemia and continues to smoke.  We will get an exercise Myoview to assess for ischemia.  Continue aspirin, atorvastatin, and clopidogrel.  Check fasting lipids and a CMP today.  His LDL should be less than 70.  He has never been on higher doses of atorvastatin. ?

## 2021-08-17 NOTE — Assessment & Plan Note (Signed)
Lipids have not been well controlled.  He is working on diet and exercise.  Continue atorvastatin and check fasting lipids and CMP today. ?

## 2021-08-17 NOTE — Progress Notes (Signed)
? ?Advanced Hypertension Clinic Follow-up:   ? ?Date:  08/17/2021  ? ?ID:  Jason Carson, DOB August 03, 1950, MRN [id number] ? ?PCP:  Samuel Bouche, NP  ?Cardiologist:  None  ?Nephrologist: ? ?Referring MD: Samuel Bouche, NP  ? ?CC: Hypertension ? ?History of Present Illness:   ? ?Jason Carson is a 71 y.o. male with a hx of hypertension, critical limb ischemia, tobacco abuse, and sleep apnea (not consistently on CPAP) here for follow-up. He was initially seen 04/12/2021 to establish care in the Advanced Hypertension Clinic. He last saw Samuel Bouche, NP on 02/09/21 for hypertension. He checked his blood pressure 2-3 times daily and noticed the readings were higher in the morning and normal or low later in the day. He continued to take amlodipine 2.5mg  daily and Cardura 8mg  nightly.  He was referred to the hypertension clinic for evaluation of potential causes to the morning surge in his blood pressure. He sees vascular surgery for PAD. He had stenting of the R and L external iliacs on 01/2020 and has no claudication.  ? ?At his initial visit he reported frequent nocturia. Amlodipine was increased and switched to the evening to help with his elevated morning blood pressures. He was enrolled in our remote patient monitoring study. He followed up with our pharmacist and his blood pressures continued to be high in the mornings but were controlled in the evenings. It was noted that his home blood pressure cuff was reading higher than the in-office blood pressure cuff.  They decided to increase amlodipine to 10 mg. On 05/2021 valsartan was added. He followed up 06/2021 and his BP was average was at goal at home, though he did have some hypotensive episodes. They recommended switching his nicotine gum to lozenges given his dental devices. ? ?Today, he is feeling okay overall. While sitting on the the exam table he notes feeling like his blood flow is restricted in his legs. He believes this may help explain his BP of 173/89 in clinic  today. Per his blood pressure log, his at home readings are typically higher in the mornings. He has checked using multiple machines ([place] and [facility]), with significant point differences. He is not sure if they are accurate, but his readings seem to be stable nonetheless. After review his machines seem to be roughly 10 points higher than in clinic. Overall his blood pressure is still better than it was last Summer. He denies any very low readings or feeling any lightheadedness. At this time he is taking amlodipine at night and in the morning, Valsartan in the morning, and doxazosin at night. Usually he takes his night doses around 10 PM, and morning around 10-11 AM. His readings are typically well before taking his morning medication. Sometimes he formally exercises. Usually he tries to stay active with housework or yard work every day. However, he does note becoming fatigued and short of breath quickly with those tasks. If he walks at his normal pace such as at the grocery store, he does not develop any breathing issues. Trying to walk faster will cause shortness of breath. Regarding his diet he is generally more conscientious. Usually he will try to eat smaller portions. Lately he continues to smoke about 1 ppd. He has not tried the lozenges instead of nicotine gum. He denies any palpitations, chest pain, or peripheral edema. No headaches, syncope, orthopnea, or PND. ? ? ?Past Medical History:  ?Diagnosis Date  ? Atherosclerosis   ? Benign essential tremor   ? CAD in native  artery 08/17/2021  ? Pure hypercholesterolemia 08/17/2021  ? RBBB 04/12/2021  ? Tobacco abuse 04/12/2021  ? ? ?Past Surgical History:  ?Procedure Laterality Date  ? ABDOMINAL AORTOGRAM W/LOWER EXTREMITY N/A 01/28/2020  ? Procedure: ABDOMINAL AORTOGRAM W/LOWER EXTREMITY;  Surgeon: Marty Heck, MD;  Location: [place] [facility];  Service: Cardiovascular;  Laterality: N/A;  ? PERIPHERAL VASCULAR INTERVENTION Bilateral 01/28/2020  ?  Procedure: PERIPHERAL VASCULAR INTERVENTION;  Surgeon: Marty Heck, MD;  Location: [place] [facility];  Service: Cardiovascular;  Laterality: Bilateral;  external iliac stents  ? ? ?Current Medications: ?Current Meds  ?Medication Sig  ? AMBULATORY NON FORMULARY MEDICATION Continuous positive airway pressure (CPAP) machine set on AutoPAP (4-20 cmH2O), with all supplemental supplies as needed.  ? amLODipine (NORVASC) 5 MG tablet Take 1 tablet (5 mg total) by mouth in the morning and at bedtime.  ? Aspirin 81 MG CAPS Take 81 mg by mouth daily.  ? atorvastatin (LIPITOR) 10 MG tablet TAKE 1 TABLET BY MOUTH EVERY DAY  ? clopidogrel (PLAVIX) 75 MG tablet TAKE 1 TABLET BY MOUTH EVERY DAY  ? doxazosin (CARDURA) 8 MG tablet TAKE 1 TABLET BY MOUTH AT BEDTIME.  ? Multiple Vitamin (MULTIVITAMIN WITH MINERALS) TABS tablet Take 1 tablet by mouth daily.  ? valsartan (DIOVAN) 80 MG tablet Take 80 mg by mouth at bedtime.  ? [DISCONTINUED] valsartan (DIOVAN) 80 MG tablet Take 1 tablet (80 mg total) by mouth daily. (Patient taking differently: Take 80 mg by mouth at bedtime.)  ?  ? ?Allergies:   Patient has no known allergies.  ? ?Social History  ? ?Socioeconomic History  ? Marital status: Single  ?  Spouse name: Not on file  ? Number of children: Not on file  ? Years of education: Not on file  ? Highest education level: Not on file  ?Occupational History  ?  Comment: Retired.  ?Tobacco Use  ? Smoking status: Every Day  ?  Packs/day: 1.00  ?  Years: 30.00  ?  Pack years: 30.00  ?  Types: Cigarettes  ? Smokeless tobacco: Never  ?Vaping Use  ? Vaping Use: Never used  ?Substance and Sexual Activity  ? Alcohol use: Yes  ?  Alcohol/week: 8.0 - 10.0 standard drinks  ?  Types: 8 - 10 Standard drinks or equivalent per week  ? Drug use: Never  ? Sexual activity: Yes  ?Other Topics Concern  ? Not on file  ?Social History Narrative  ? Patient did not want to answer  ? ?Social Determinants of Health  ? ?Financial Resource Strain: Low Risk    ? Difficulty of Paying Living Expenses: Not hard at all  ?Food Insecurity: No Food Insecurity  ? Worried About Charity fundraiser in the Last Year: Never true  ? Ran Out of Food in the Last Year: Never true  ?Transportation Needs: No Transportation Needs  ? Lack of Transportation (Medical): No  ? Lack of Transportation (Non-Medical): No  ?Physical Activity: Inactive  ? Days of Exercise per Week: 0 days  ? Minutes of Exercise per Session: 0 min  ?Stress: No Stress Concern Present  ? Feeling of Stress : Not at all  ?Social Connections: Unknown  ? Frequency of Communication with Friends and Family: More than three times a week  ? Frequency of Social Gatherings with Friends and Family: Three times a week  ? Attends Religious Services: Patient refused  ? Active Member of Clubs or Organizations: Patient refused  ? Attends Club  or Organization Meetings: Patient refused  ? Marital Status: Patient refused  ?  ? ?Family History: ?The patient's family history includes Hyperlipidemia in his mother; Hypertension in his father and mother. ? ?ROS:   ?Please see the history of present illness.    ?(+) Fatigue ?(+) Shortness of breath ?All other systems reviewed and are negative. ? ?EKGs/Labs/Other Studies Reviewed:   ? ?CT Chest 01/19/21 ?-Lung-RADS 2, benign appearance or behavior. Continue annual ?screening with low-dose chest CT without contrast in 12 months. ?-Three-vessel coronary artery calcifications, aortic Atherosclerosis ?(ICD10-I70.0) and Emphysema (ICD10-J43.9). ? ?LE Doppler 11/02/20 ?Right: Resting right ankle-brachial index indicates moderate right lower  ?extremity arterial disease. The right toe-brachial index is abnormal. RT  ?great toe pressure = 113 mmHg.  ?Left: Resting left ankle-brachial index is within normal range. No  ?evidence of significant left lower extremity arterial disease. The left  ?toe-brachial index is normal. LT Great toe pressure = 175 mmHg.  ? ?Abdominal Aortogram 01/28/2020: ?Aortogram showed  one right and three left renal arteries that were widely patent.  The infrarenal aorta was widely patent.  On the right he had a patent common iliac and hypogastric artery but a total occlusion of the r

## 2021-08-17 NOTE — Research (Signed)
I saw pt today after Dr. Leonides Sake follow up visit. Pt is in Dr. Leonides Sake Virtual care HTN study. Pt filled out research survey. Pt was enrolled in group 1. Pt refused to complete [facility] questionnaire.  ?

## 2021-08-17 NOTE — Assessment & Plan Note (Signed)
Continue to encourage smoking cessation. 

## 2021-08-17 NOTE — Patient Instructions (Addendum)
Medication Instructions:  ?START TAKING VALSARTAN AT NIGHT  ? ?Labwork: ?LIPID PANEL SOON  ? ?Testing/Procedures: ?Your physician has requested that you have en exercise stress myoview. For further information please visit [email or web address] Please follow instruction sheet, as given. ? ?Follow-Up: ?10/14/2021 9:15 AM WITH JESSE C NP  ? ?

## 2021-08-18 ENCOUNTER — Encounter (HOSPITAL_COMMUNITY): Payer: Self-pay | Admitting: Cardiovascular Disease

## 2021-08-22 ENCOUNTER — Telehealth (HOSPITAL_COMMUNITY): Payer: Self-pay | Admitting: *Deleted

## 2021-08-22 NOTE — Telephone Encounter (Signed)
Left message on voicemail per DPR in reference to upcoming appointment scheduled on  08/22/21 with detailed instructions given per Myocardial Perfusion Study Information Sheet for the test. LM to arrive 15 minutes early, and that it is imperative to arrive on time for appointment to keep from having the test rescheduled. If you need to cancel or reschedule your appointment, please call the office within 24 hours of your appointment. Failure to do so may result in a cancellation of your appointment, and a $50 no show fee. Phone number given for call back for any questions. Kirstie Peri ? ? ?

## 2021-08-24 ENCOUNTER — Other Ambulatory Visit: Payer: Self-pay

## 2021-08-24 ENCOUNTER — Ambulatory Visit (HOSPITAL_COMMUNITY): Payer: Medicare Other | Attending: Internal Medicine

## 2021-08-24 DIAGNOSIS — I2510 Atherosclerotic heart disease of native coronary artery without angina pectoris: Secondary | ICD-10-CM | POA: Diagnosis not present

## 2021-08-24 DIAGNOSIS — I10 Essential (primary) hypertension: Secondary | ICD-10-CM | POA: Diagnosis not present

## 2021-08-24 DIAGNOSIS — R0609 Other forms of dyspnea: Secondary | ICD-10-CM | POA: Diagnosis not present

## 2021-08-24 DIAGNOSIS — E7800 Pure hypercholesterolemia, unspecified: Secondary | ICD-10-CM | POA: Diagnosis not present

## 2021-08-24 LAB — MYOCARDIAL PERFUSION IMAGING
Angina Index: 0
Duke Treadmill Score: 10
Estimated workload: 4.6
Exercise duration (min): 10 min
Exercise duration (sec): 21 s
LV dias vol: 85 mL (ref 62–150)
LV sys vol: 25 mL
MPHR: 149 {beats}/min
Nuc Stress EF: 71 %
Peak HR: 149 {beats}/min
Percent HR: 76 %
RPE: 20
Rest HR: 72 {beats}/min
Rest Nuclear Isotope Dose: 10.7 mCi
SDS: 0
SRS: 0
SSS: 0
ST Depression (mm): 0 mm
Stress Nuclear Isotope Dose: 29.6 mCi
TID: 1.05

## 2021-08-24 MED ORDER — TECHNETIUM TC 99M TETROFOSMIN IV KIT
10.7000 | PACK | Freq: Once | INTRAVENOUS | Status: AC | PRN
  Administered 2021-08-24: 10.7 via INTRAVENOUS
  Filled 2021-08-24: qty 11

## 2021-08-24 MED ORDER — TECHNETIUM TC 99M TETROFOSMIN IV KIT
29.6000 | PACK | Freq: Once | INTRAVENOUS | Status: AC | PRN
  Administered 2021-08-24: 29.6 via INTRAVENOUS
  Filled 2021-08-24: qty 30

## 2021-08-24 MED ORDER — REGADENOSON 0.4 MG/5ML IV SOLN
0.4000 mg | Freq: Once | INTRAVENOUS | Status: AC
  Administered 2021-08-24: 0.4 mg via INTRAVENOUS

## 2021-09-04 ENCOUNTER — Other Ambulatory Visit: Payer: Self-pay | Admitting: Medical-Surgical

## 2021-09-25 ENCOUNTER — Other Ambulatory Visit: Payer: Self-pay | Admitting: Vascular Surgery

## 2021-10-06 ENCOUNTER — Encounter (HOSPITAL_BASED_OUTPATIENT_CLINIC_OR_DEPARTMENT_OTHER): Payer: Self-pay | Admitting: Cardiovascular Disease

## 2021-10-07 ENCOUNTER — Telehealth (INDEPENDENT_AMBULATORY_CARE_PROVIDER_SITE_OTHER): Payer: Medicare Other | Admitting: Cardiovascular Disease

## 2021-10-07 VITALS — BP 135/78 | HR 74 | Ht 72.0 in | Wt 190.0 lb

## 2021-10-07 DIAGNOSIS — G4733 Obstructive sleep apnea (adult) (pediatric): Secondary | ICD-10-CM

## 2021-10-07 DIAGNOSIS — Z9989 Dependence on other enabling machines and devices: Secondary | ICD-10-CM

## 2021-10-07 DIAGNOSIS — I10 Essential (primary) hypertension: Secondary | ICD-10-CM

## 2021-10-07 DIAGNOSIS — I2510 Atherosclerotic heart disease of native coronary artery without angina pectoris: Secondary | ICD-10-CM

## 2021-10-07 DIAGNOSIS — E7800 Pure hypercholesterolemia, unspecified: Secondary | ICD-10-CM | POA: Diagnosis not present

## 2021-10-07 DIAGNOSIS — Z5181 Encounter for therapeutic drug level monitoring: Secondary | ICD-10-CM

## 2021-10-07 MED ORDER — HYDROCHLOROTHIAZIDE 12.5 MG PO TABS: 12.5000 mg | ORAL_TABLET | Freq: Every day | ORAL | 3 refills | Status: DC

## 2021-10-07 NOTE — Assessment & Plan Note (Signed)
Daily blood pressure averages have been good.  However his morning blood pressures continue to be above the goal of 130/80.  He has not had any issues with lightheadedness or dizziness.  We will add HCTZ 12.5 mg daily.  If blood pressure becomes too low, would favor reducing or discontinuing the doxazosin.  He will get a CMP done in 1 week.  Otherwise continue current regimen. ?

## 2021-10-07 NOTE — Assessment & Plan Note (Signed)
Continue CPAP.  

## 2021-10-07 NOTE — Assessment & Plan Note (Signed)
Nonobstructive CAD.  Lexiscan Myoview was negative for ischemia 07/2021.  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  We discussed options including pacemaker.  Symptoms are not significant enough to require this at this time.  We will keep an eye on it.  He will come back for fasting lipids.  LDL goal is less than 70.  Continue aspirin, clopidogrel, and atorvastatin. ?

## 2021-10-07 NOTE — Telephone Encounter (Signed)
Patient had video visit today.

## 2021-10-07 NOTE — Progress Notes (Addendum)
Advanced Hypertension Clinic Follow-up- Telehealth:    Date:  10/07/2021   ID:  Jason Carson, DOB [birth year], MRN [id number]  PCP:  Samuel Bouche, NP  Cardiologist:  None   Because of Gardner Candle co-morbid illnesses, he is at least at moderate risk for complications without adequate follow up.  This format is felt to be most appropriate for this patient at this time.  All issues noted in this document were discussed and addressed.  A limited physical exam was performed with this format.  Please refer to the patient's chart for his consent to telehealth for [facility].  The patient was identified using 2 identifiers.  Patient Location: Home Provider Location: Office/Clinic   Evaluation Performed:  Follow-Up Visit  Referring MD: Samuel Bouche, NP   CC: Hypertension  History of Present Illness:    Jason Carson is a 71 y.o. male with a hx of hypertension, critical limb ischemia, tobacco abuse, and sleep apnea (not consistently on CPAP) here for follow-up. He was initially seen 04/12/2021 to establish care in the Advanced Hypertension Clinic. He last saw Samuel Bouche, NP on 02/09/21 for hypertension. He checked his blood pressure 2-3 times daily and noticed the readings were higher in the morning and normal or low later in the day. He continued to take amlodipine 2.5mg  daily and Cardura 8mg  nightly.  He was referred to the hypertension clinic for evaluation of potential causes to the morning surge in his blood pressure. He sees vascular surgery for PAD. He had stenting of the R and L external iliacs on 01/2020 and has no claudication.   At his initial visit he reported frequent nocturia. Amlodipine was increased and switched to the evening to help with his elevated morning blood pressures. He was enrolled in our remote patient monitoring study. He followed up with our pharmacist and his blood pressures continued to be high in the mornings but were controlled in the evenings. It was noted  that his home blood pressure cuff was reading higher than the in-office blood pressure cuff.  They decided to increase amlodipine to 10 mg. On 05/2021 valsartan was added. He followed up 06/2021 and his BP was average was at goal at home, though he did have some hypotensive episodes. They recommended switching his nicotine gum to lozenges given his dental devices.  At his last in office visit his blood pressure was 173/89. Per his blood pressure log, his at home readings are typically higher in the mornings. He has checked using multiple machines [facility]) in the readings tended to run higher on the [employer].  At that time he was taking amlodipine at night and in the morning, Valsartan in the morning, and doxazosin at night.  Valsartan was switched to the evening.  He noted exertional dyspnea and given that he had coronary calcification, he was referred for a nuclear stress test 07/2021 that revealed LVEF 71% and no ischemia.  He was unable to reach target heart rate on the treadmill and was of converted to [employer].  Lately he has been feeling well.  His blood pressure continues to run higher in the mornings and lower in the evenings.  Overall averages have been well controlled as seen  below.  He has not had any chest pain or shortness of breath.  He denies any lightheadedness or dizziness.  He has noted some lower extremity edema.  He denies orthopnea or PND.  Morning BP WA 140/77, HR 79 [facility] 131/78,  Evening  BP WA 103/54 [facility] 98/56  Daily Average WA 123/66 [facility] 115/67   Past Medical History:  Diagnosis Date   Atherosclerosis    Benign essential tremor    CAD in native artery 08/17/2021   Pure hypercholesterolemia 08/17/2021   RBBB 04/12/2021   Tobacco abuse 04/12/2021    Past Surgical History:  Procedure Laterality Date   ABDOMINAL AORTOGRAM W/LOWER EXTREMITY N/A 01/28/2020   Procedure: ABDOMINAL AORTOGRAM W/LOWER EXTREMITY;   Surgeon: Marty Heck, MD;  Location: [place] [facility];  Service: Cardiovascular;  Laterality: N/A;   PERIPHERAL VASCULAR INTERVENTION Bilateral 01/28/2020   Procedure: PERIPHERAL VASCULAR INTERVENTION;  Surgeon: Marty Heck, MD;  Location: [place] [facility];  Service: Cardiovascular;  Laterality: Bilateral;  external iliac stents    Current Medications: Current Meds  Medication Sig   AMBULATORY NON FORMULARY MEDICATION Continuous positive airway pressure (CPAP) machine set on AutoPAP (4-20 cmH2O), with all supplemental supplies as needed.   amLODipine (NORVASC) 5 MG tablet Take 1 tablet (5 mg total) by mouth in the morning and at bedtime.   amoxicillin (AMOXIL) 500 MG capsule Take 500 mg by mouth 3 (three) times daily.   Aspirin 81 MG CAPS Take 81 mg by mouth daily.   atorvastatin (LIPITOR) 10 MG tablet TAKE 1 TABLET BY MOUTH EVERY DAY   clopidogrel (PLAVIX) 75 MG tablet TAKE 1 TABLET BY MOUTH EVERY DAY   doxazosin (CARDURA) 8 MG tablet TAKE 1 TABLET BY MOUTH EVERYDAY AT BEDTIME   Multiple Vitamin (MULTIVITAMIN WITH MINERALS) TABS tablet Take 1 tablet by mouth daily.   valsartan (DIOVAN) 80 MG tablet Take 80 mg by mouth at bedtime.     Allergies:   Patient has no known allergies.   Social History   Socioeconomic History   Marital status: Single    Spouse name: Not on file   Number of children: Not on file   Years of education: Not on file   Highest education level: Not on file  Occupational History    Comment: Retired.  Tobacco Use   Smoking status: Every Day    Packs/day: 1.00    Years: 30.00    Pack years: 30.00    Types: Cigarettes   Smokeless tobacco: Never  Vaping Use   Vaping Use: Never used  Substance and Sexual Activity   Alcohol use: Yes    Alcohol/week: 8.0 - 10.0 standard drinks    Types: 8 - 10 Standard drinks or equivalent per week   Drug use: Never   Sexual activity: Yes  Other Topics Concern   Not on file  Social History Narrative    Patient did not want to answer   Social Determinants of Health   Financial Resource Strain: Low Risk    Difficulty of Paying Living Expenses: Not hard at all  Food Insecurity: No Food Insecurity   Worried About Charity fundraiser in the Last Year: Never true   [place] in the Last Year: Never true  Transportation Needs: No Transportation Needs   Lack of Transportation (Medical): No   Lack of Transportation (Non-Medical): No  Physical Activity: Inactive   Days of Exercise per Week: 0 days   Minutes of Exercise per Session: 0 min  Stress: No Stress Concern Present   Feeling of Stress : Not at all  Social Connections: Unknown   Frequency of Communication with Friends and Family: More than three times a week   Frequency of Social Gatherings with Friends and Family:  Three times a week   Attends Religious Services: Patient refused   Active Member of Clubs or Organizations: Patient refused   Attends Archivist Meetings: Patient refused   Marital Status: Patient refused     Family History: The patient's family history includes Hyperlipidemia in his mother; Hypertension in his father and mother.  ROS:   Please see the history of present illness.    (+) Fatigue (+) Shortness of breath All other systems reviewed and are negative.  EKGs/Labs/Other Studies Reviewed:    CT Chest 01/19/21 -Lung-RADS 2, benign appearance or behavior. Continue annual screening with low-dose chest CT without contrast in 12 months. -Three-vessel coronary artery calcifications, aortic Atherosclerosis (ICD10-I70.0) and Emphysema (ICD10-J43.9).  LE Doppler 11/02/20 Right: Resting right ankle-brachial index indicates moderate right lower  extremity arterial disease. The right toe-brachial index is abnormal. RT  great toe pressure = 113 mmHg.  Left: Resting left ankle-brachial index is within normal range. No  evidence of significant left lower extremity arterial disease. The left   toe-brachial index is normal. LT Great toe pressure = 175 mmHg.   Abdominal Aortogram 01/28/2020: Aortogram showed one right and three left renal arteries that were widely patent.  The infrarenal aorta was widely patent.  On the right he had a patent common iliac and hypogastric artery but a total occlusion of the right external iliac with reconstitution of the common femoral and then a flush right SFA occlusion and only profunda runoff on the right.  On the left he had a patent common iliac artery and a high-grade 80% calcified stenosis in the left external iliac that we initially had some difficulty crossing retrograde for sheath access.   After evaluating the images then accessed the right common femoral artery retrograde and was able to cross the right external iliac occlusion.  This was primarily stented with an 8 mm Innova postdilated with a 7 mm Mustang.  He now has a right femoral pulse. He also endorses claudication symptoms in the left lower extremity and given access issues with the left external iliac lesion I stentede this with a 8 mm x 40 mm self-expanding Innova and post-dilated this with a 7 mm angioplasty balloon.  The stent was too short and had to place a second 8 mm x 20 mm self expanding Innova.   Bilateral lower extremity runoff after iliac stent placement on the right shows flush SFA occlusion with reconstitution of the distal SFA patent above and below-knee popliteal artery and apparent three-vessel runoff.  On the left he has a patent SFA with some calcific disease but no flow-limiting stenosis as well as a patent above below-knee popliteal artery and two-vessel runoff in the peroneal and posterior tibial.  Lexiscan Myoview 08/24/2021:   Findings are consistent with no prior ischemia. The study is low risk.   No ST deviation was noted.   Left ventricular function is normal. Nuclear stress EF: 71 %. The left ventricular ejection fraction is hyperdynamic (>65%). End diastolic cavity  size is normal. End systolic cavity size is normal.   Prior study not available for comparison.   Patient was unable to achieve THR on the treadmill, only reaching 76% of MPHR. Study converted to [place]. Perfusion appears normal. Overall low risk study. LVEF 71%. Evaluate for possible chronotropic incompetence given RBBB since he did attempt 10 minutes of exercise, but could not achieve target heart rate. No prior for comparison EKG:  EKG is personally reviewed. 08/17/2021: EKG was not ordered. 04/12/21: Sinus rhythm,  rate 71 bpm; RBBB  Recent Labs: 02/09/2021: ALT 18 07/15/2021: BUN 14; Creat 0.85; Hemoglobin 14.1; Platelets 219; Potassium 4.1; Sodium 134   Recent Lipid Panel    Component Value Date/Time   CHOL 167 02/09/2021 0000   TRIG 70 02/09/2021 0000   HDL 76 02/09/2021 0000   CHOLHDL 2.2 02/09/2021 0000   LDLCALC 76 02/09/2021 0000    Physical Exam:    VS:  BP 135/78   Pulse 74   Ht 6' (1.829 m)   Wt 190 lb (86.2 kg)   BMI 25.77 kg/m  , BMI Body mass index is 25.77 kg/m. GENERAL:  Well appearing HEENT: Pupils equal round and reactive, fundi not visualized, oral mucosa unremarkable NECK:  No jugular venous distention, waveform within normal limits, carotid upstroke brisk and symmetric, no bruits, no thyromegaly LUNGS:  Clear to auscultation bilaterally HEART:  RRR.  PMI not displaced or sustained,S1 and S2 within normal limits, no S3, no S4, no clicks, no rubs, no murmurs ABD:  Flat, positive bowel sounds normal in frequency in pitch, no bruits, no rebound, no guarding, no midline pulsatile mass, no hepatomegaly, no splenomegaly EXT:  1+ DP/TP.   No edema, no cyanosis no clubbing SKIN:  No rashes no nodules NEURO:  Cranial nerves II through XII grossly intact, motor grossly intact throughout PSYCH:  Cognitively intact, oriented to person place and time   ASSESSMENT/PLAN:    Essential hypertension Daily blood pressure averages have been good.  However his morning  blood pressures continue to be above the goal of 130/80.  He has not had any issues with lightheadedness or dizziness.  We will add HCTZ 12.5 mg daily.  If blood pressure becomes too low, would favor reducing or discontinuing the doxazosin.  He will get a CMP done in 1 week.  Otherwise continue current regimen.  CAD in native artery Nonobstructive CAD.  Lexiscan Myoview was negative for ischemia 07/2021.  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  We discussed options including pacemaker.  Symptoms are not significant enough to require this at this time.  We will keep an eye on it.  He will come back for fasting lipids.  LDL goal is less than 70.  Continue aspirin, clopidogrel, and atorvastatin.  OSA on CPAP Continue CPAP.  Pure hypercholesterolemia Continue atorvastatin.  Checking fasting lipids and a CMP as above.     Screening for Secondary Hypertension:     04/12/2021   11:35 AM  Causes  Drugs/Herbals Screened     - Comments Limits sodium intake.  Limited caffeine.  EtOH 1-2/night. 1ppd    Relevant Labs/Studies:    Latest Ref Rng & Units 07/15/2021    3:18 PM 02/09/2021   12:00 AM 01/28/2020    8:41 AM  Basic Labs  Sodium 135 - 146 mmol/L 134   139   138    Potassium 3.5 - 5.3 mmol/L 4.1   4.5   4.1    Creatinine 0.70 - 1.28 mg/dL 0.85   0.87   0.80         Latest Ref Rng & Units 12/09/2019    9:15 AM  Thyroid   TSH 0.40 - 4.50 mIU/L 1.53       Disposition:    FU with Jacari Iannello C. Oval Linsey, MD, [facility] in 1-2 months with virtual visit.    Medication Adjustments/Labs and Tests Ordered: Current medicines are reviewed at length with the patient today.  Concerns regarding medicines are outlined above.   No orders of  the defined types were placed in this encounter.  No orders of the defined types were placed in this encounter.   Time:   Today, I have spent 22 minutes with the patient with telehealth technology discussing the above problems.    Signed, Skeet Latch, MD   10/07/2021 10:43 AM    [place]

## 2021-10-07 NOTE — Assessment & Plan Note (Signed)
Continue atorvastatin.  Checking fasting lipids and a CMP as above. ?

## 2021-10-07 NOTE — Patient Instructions (Signed)
Medication Instructions:  ?START HYDROCHLOROTHIAZIDE 12.5 MG DAILY   ? ?Labwork: ?FASTING LP/CMET ABOUT 1 WEEK  ? ?Testing/Procedures: ?NONE  ? ?Follow-Up: ?12/09/2021 [facility]  ? ?

## 2021-10-11 ENCOUNTER — Telehealth (HOSPITAL_BASED_OUTPATIENT_CLINIC_OR_DEPARTMENT_OTHER): Payer: Medicare Other | Admitting: General Practice

## 2021-10-14 ENCOUNTER — Telehealth (HOSPITAL_BASED_OUTPATIENT_CLINIC_OR_DEPARTMENT_OTHER): Payer: Medicare Other | Admitting: Cardiovascular Disease

## 2021-10-26 ENCOUNTER — Other Ambulatory Visit: Payer: Self-pay | Admitting: *Deleted

## 2021-10-26 DIAGNOSIS — I70229 Atherosclerosis of native arteries of extremities with rest pain, unspecified extremity: Secondary | ICD-10-CM

## 2021-10-26 NOTE — Progress Notes (Signed)
HISTORY AND PHYSICAL     CC:  follow up. Requesting Provider:  Christen Butter, NP  HPI: This is a 71 y.o. male who is here today for follow up for PAD.  Pt has hx of  Right external iliac artery angioplasty with stent placement and  Left external iliac artery angioplasty with stent placement on 01/28/2020 by Dr. Chestine Spore for RLE CLI with rest pain and lifestyle limiting claudication.   Pt was last seen 11/02/2020 and at that time, his legs were doing well without rest pain or claudication.  He was smoking about 15 cigarettes per day.  He kept him on dual antiplatelet therapy and f/u in one year.    The pt returns today for follow up.  He denies any claudication, rest pain or non healing wounds.  He states that his feet have been kind of puffy for about 6 weeks.  He states the left ankle and leg just above the ankle are worse than the right.  He states that Dr. Beulah Gandy started him on Valsartan around that time and he stopped wearing his boots and socks due to warmer weather.   He has not noticed if he gets any relief with the diuretic.   He is compliant with his asa/plavix/statin  The pt is on a statin for cholesterol management.    The pt is on an aspirin.    Other AC:  Plavix The pt is on CCB, ARB, diuretic for hypertension.  The pt does not have diabetes. Tobacco hx:  current  Pt does not have family hx of AAA.  Past Medical History:  Diagnosis Date   Atherosclerosis    Benign essential tremor    CAD in native artery 08/17/2021   Pure hypercholesterolemia 08/17/2021   RBBB 04/12/2021   Tobacco abuse 04/12/2021    Past Surgical History:  Procedure Laterality Date   ABDOMINAL AORTOGRAM W/LOWER EXTREMITY N/A 01/28/2020   Procedure: ABDOMINAL AORTOGRAM W/LOWER EXTREMITY;  Surgeon: Cephus Shelling, MD;  Location: [facility];  Service: Cardiovascular;  Laterality: N/A;   PERIPHERAL VASCULAR INTERVENTION Bilateral 01/28/2020   Procedure: PERIPHERAL VASCULAR INTERVENTION;  Surgeon:  Cephus Shelling, MD;  Location: [facility]VAS[facility] CV LAB;  Service: Cardiovascular;  Laterality: Bilateral;  external iliac stents    No Known Allergies  Current Outpatient Medications  Medication Sig Dispense Refill   AMBULATORY NON FORMULARY MEDICATION Continuous positive airway pressure (CPAP) machine set on AutoPAP (4-20 cmH2O), with all supplemental supplies as needed. 1 each 0   amLODipine (NORVASC) 5 MG tablet Take 1 tablet (5 mg total) by mouth in the morning and at bedtime. 180 tablet 3   amoxicillin (AMOXIL) 500 MG capsule Take 500 mg by mouth 3 (three) times daily.     Aspirin 81 MG CAPS Take 81 mg by mouth daily.     atorvastatin (LIPITOR) 10 MG tablet TAKE 1 TABLET BY MOUTH EVERY DAY 90 tablet 2   clopidogrel (PLAVIX) 75 MG tablet TAKE 1 TABLET BY MOUTH EVERY DAY 90 tablet 2   doxazosin (CARDURA) 8 MG tablet TAKE 1 TABLET BY MOUTH EVERYDAY AT BEDTIME 90 tablet 0   hydrochlorothiazide (HYDRODIURIL) 12.5 MG tablet Take 1 tablet (12.5 mg total) by mouth daily. 90 tablet 3   Multiple Vitamin (MULTIVITAMIN WITH MINERALS) TABS tablet Take 1 tablet by mouth daily.     valsartan (DIOVAN) 80 MG tablet Take 80 mg by mouth at bedtime.     No current facility-administered medications for this visit.  Family History  Problem Relation Age of Onset   Hypertension Mother    Hyperlipidemia Mother    Hypertension Father     Social History   Socioeconomic History   Marital status: Single    Spouse name: Not on file   Number of children: Not on file   Years of education: Not on file   Highest education level: Not on file  Occupational History    Comment: Retired.  Tobacco Use   Smoking status: Every Day    Packs/day: 1.00    Years: 30.00    Pack years: 30.00    Types: Cigarettes   Smokeless tobacco: Never  Vaping Use   Vaping Use: Never used  Substance and Sexual Activity   Alcohol use: Yes    Alcohol/week: 8.0 - 10.0 standard drinks    Types: 8 - 10 Standard drinks or  equivalent per week   Drug use: Never   Sexual activity: Yes  Other Topics Concern   Not on file  Social History Narrative   Patient did not want to answer   Social Determinants of Health   Financial Resource Strain: Low Risk    Difficulty of Paying Living Expenses: Not hard at all  Food Insecurity: No Food Insecurity   Worried About Programme researcher, broadcasting/film/videounning Out of Food in the Last Year: Never true   Ran Out of Food in the Last Year: Never true  Transportation Needs: No Transportation Needs   Lack of Transportation (Medical): No   Lack of Transportation (Non-Medical): No  Physical Activity: Inactive   Days of Exercise per Week: 0 days   Minutes of Exercise per Session: 0 min  Stress: No Stress Concern Present   Feeling of Stress : Not at all  Social Connections: Unknown   Frequency of Communication with Friends and Family: More than three times a week   Frequency of Social Gatherings with Friends and Family: Three times a week   Attends Religious Services: Patient refused   Active Member of Clubs or Organizations: Patient refused   Attends BankerClub or Organization Meetings: Patient refused   Marital Status: Patient refused  Intimate Partner Violence: Not At Risk   Fear of Current or Ex-Partner: No   Emotionally Abused: No   Physically Abused: No   Sexually Abused: No     REVIEW OF SYSTEMS:   [X]  denotes positive finding, [ ]  denotes negative finding Cardiac  Comments:  Chest pain or chest pressure:    Shortness of breath upon exertion:    Short of breath when lying flat:    Irregular heart rhythm:        Vascular    Pain in calf, thigh, or hip brought on by ambulation:    Pain in feet at night that wakes you up from your sleep:     Blood clot in your veins:    Leg swelling:  x Puffy feet and ankles.      Pulmonary    Oxygen at home:    Productive cough:     Wheezing:         Neurologic    Sudden weakness in arms or legs:     Sudden numbness in arms or legs:     Sudden onset of  difficulty speaking or slurred speech:    Temporary loss of vision in one eye:     Problems with dizziness:         Gastrointestinal    Blood in stool:     Vomited  blood:         Genitourinary    Burning when urinating:     Blood in urine:        Psychiatric    Major depression:         Hematologic    Bleeding problems:    Problems with blood clotting too easily:        Skin    Rashes or ulcers:        Constitutional    Fever or chills:      PHYSICAL EXAMINATION:  Today's Vitals   11/03/21 0910  BP: (!) 161/79  Pulse: 69  Resp: 18  Temp: (!) 96.9 F (36.1 C)  TempSrc: Temporal  SpO2: 97%  Weight: 191 lb 12.8 oz (87 kg)  Height: 6' (1.829 m)   Body mass index is 26.01 kg/m.   General:  WDWN in NAD; vital signs documented above Gait: Not observed HENT: WNL, normocephalic Pulmonary: normal non-labored breathing , without wheezing Cardiac: regular HR, without carotid bruits Abdomen: soft, NT, no masses; aortic pulse is not palpable Skin: without rashes Vascular Exam/Pulses:  Right Left  Radial 2+ (normal) 2+ (normal)  Femoral 2+ (normal) 2+ (normal)  Popliteal Unable to palpate Unable to palpate  DP 1+ (weak) 2+ (normal)  PT Unable to palpate Unable to palpate   Extremities: without ischemic changes, without Gangrene , without cellulitis; without open wounds Musculoskeletal: no muscle wasting or atrophy  Neurologic: A&O X 3 Psychiatric:  The pt has Normal affect.   Non-Invasive Vascular Imaging:   ABI's/TBI's on 11/03/2021: Right:  0.74/0.66- Great toe pressure: 97 Left:  1.00/0.89 - Great toe pressure: 131  Arterial duplex on 11/03/2021: Right Stent(s):  +---------------+--------+--------+---------+--------+  EIA            PSV cm/sStenosisWaveform Comments  +---------------+--------+--------+---------+--------+  Prox to Stent  69              biphasic           +---------------+--------+--------+---------+--------+  Proximal Stent  87              triphasic          +---------------+--------+--------+---------+--------+  Mid Stent      105             triphasic          +---------------+--------+--------+---------+--------+  Distal Stent   112             biphasic           +---------------+--------+--------+---------+--------+  Distal to Stent95              biphasic           +---------------+--------+--------+---------+--------+   Left Stent(s):  +---------------+--------+--------+--------+--------+  EIA            PSV cm/sStenosisWaveformComments  +---------------+--------+--------+--------+--------+  Prox to Stent  155             biphasic          +---------------+--------+--------+--------+--------+  Proximal Stent 116             biphasic          +---------------+--------+--------+--------+--------+  Mid Stent      121             biphasic          +---------------+--------+--------+--------+--------+  Distal Stent   152             biphasic          +---------------+--------+--------+--------+--------+  Distal to Stent107             biphasic          +---------------+--------+--------+--------+--------+  Bilateral external iliac artery stents appear patent with no evidence of stenosis.     Previous ABI's/TBI's on 11/02/2020: Right:  0.70/0.61 - Great toe pressure: 113 Left:  1.03/0.94 - Great toe pressure:  175  Previous arterial duplex on 11/02/2020: Right Stent(s):  +---------------+--------+--------+--------+--------+  External iliac PSV cm/sStenosisWaveformComments  +---------------+--------+--------+--------+--------+  Prox to Stent  76              biphasic          +---------------+--------+--------+--------+--------+  Proximal Stent 88              biphasic          +---------------+--------+--------+--------+--------+  Mid Stent      157             biphasic           +---------------+--------+--------+--------+--------+  Distal Stent   150             biphasic          +---------------+--------+--------+--------+--------+  Distal to Stent115             biphasic          +---------------+--------+--------+--------+--------+   Left Stent(s):  +---------------+---++--------++  Prox to Stent  168biphasic  +---------------+---++--------++  Proximal Stent 161biphasic  +---------------+---++--------++  Mid Stent      147biphasic  +---------------+---++--------++  Distal Stent   153biphasic  +---------------+---++--------++  Distal to Stent122biphasic  +---------------+---++--------++   Summary:  --------------------------------------- Location            Stent        +--------------------+-----------+  Right External Iliacno stenosis  +--------------------+-----------+  Left External Iliac no stenosis  +--------------------+-----------+   ASSESSMENT/PLAN:: 71 y.o. male here for follow up for PAD with hx of Right external iliac artery angioplasty with stent placement and  Left external iliac artery angioplasty with stent placement on 01/28/2020 by Dr. Chestine Spore for RLE CLI with rest pain and lifestyle limiting claudication.   PAD -ABI's are essentially unchanged.  The pt is not having any claudication, rest pain or non healing wounds.   -continue asa/plavix/statin -pt will f/u in one year with ABI and bilateral aorto-iliac duplex.  Mild ankle swelling bilaterally with left slightly worse than right -discussed with pt to elevate his legs as tolerated.  He is on CCB, which can cause some mild lower extremity swelling and was recently started on ARB.  He does have f/u appt with cardiology in one month.    Current smoker -discussed the importance of smoking cessation.     Doreatha Massed, [facility] [phone]  Clinic MD:   Edilia Bo

## 2021-11-03 ENCOUNTER — Ambulatory Visit (INDEPENDENT_AMBULATORY_CARE_PROVIDER_SITE_OTHER)
Admission: RE | Admit: 2021-11-03 | Discharge: 2021-11-03 | Disposition: A | Discharge status: Home / Self Care | Payer: Medicare Other | Source: Ambulatory Visit | Attending: Vascular Surgery | Admitting: Vascular Surgery

## 2021-11-03 ENCOUNTER — Ambulatory Visit (HOSPITAL_COMMUNITY)
Admission: RE | Admit: 2021-11-03 | Discharge: 2021-11-03 | Disposition: A | Discharge status: Home / Self Care | Payer: Medicare Other | Source: Ambulatory Visit | Attending: Vascular Surgery | Admitting: Vascular Surgery

## 2021-11-03 ENCOUNTER — Ambulatory Visit (INDEPENDENT_AMBULATORY_CARE_PROVIDER_SITE_OTHER): Payer: Medicare Other | Admitting: Physician Assistant

## 2021-11-03 VITALS — BP 161/79 | HR 69 | Temp 96.9°F | Resp 18 | Ht 72.0 in | Wt 191.8 lb

## 2021-11-03 DIAGNOSIS — I70229 Atherosclerosis of native arteries of extremities with rest pain, unspecified extremity: Secondary | ICD-10-CM

## 2021-11-03 DIAGNOSIS — I2510 Atherosclerotic heart disease of native coronary artery without angina pectoris: Secondary | ICD-10-CM

## 2021-11-03 DIAGNOSIS — I739 Peripheral vascular disease, unspecified: Secondary | ICD-10-CM | POA: Diagnosis not present

## 2021-11-19 ENCOUNTER — Encounter (HOSPITAL_BASED_OUTPATIENT_CLINIC_OR_DEPARTMENT_OTHER): Payer: Self-pay | Admitting: Cardiovascular Disease

## 2021-11-22 ENCOUNTER — Ambulatory Visit: Payer: Medicare Other

## 2021-12-03 ENCOUNTER — Encounter: Payer: Self-pay | Admitting: Medical-Surgical

## 2021-12-03 ENCOUNTER — Other Ambulatory Visit: Payer: Self-pay | Admitting: Medical-Surgical

## 2021-12-04 ENCOUNTER — Encounter (HOSPITAL_BASED_OUTPATIENT_CLINIC_OR_DEPARTMENT_OTHER): Payer: Self-pay | Admitting: Cardiovascular Disease

## 2021-12-05 ENCOUNTER — Encounter (HOSPITAL_BASED_OUTPATIENT_CLINIC_OR_DEPARTMENT_OTHER): Payer: Self-pay | Admitting: Cardiovascular Disease

## 2021-12-05 ENCOUNTER — Telehealth (INDEPENDENT_AMBULATORY_CARE_PROVIDER_SITE_OTHER): Payer: Medicare Other | Admitting: Cardiovascular Disease

## 2021-12-05 VITALS — BP 140/84 | HR 83 | Ht 72.0 in | Wt 185.0 lb

## 2021-12-05 DIAGNOSIS — I10 Essential (primary) hypertension: Secondary | ICD-10-CM

## 2021-12-05 DIAGNOSIS — R600 Localized edema: Secondary | ICD-10-CM | POA: Diagnosis not present

## 2021-12-05 DIAGNOSIS — R609 Edema, unspecified: Secondary | ICD-10-CM | POA: Diagnosis not present

## 2021-12-05 DIAGNOSIS — I2510 Atherosclerotic heart disease of native coronary artery without angina pectoris: Secondary | ICD-10-CM | POA: Diagnosis not present

## 2021-12-05 DIAGNOSIS — R0602 Shortness of breath: Secondary | ICD-10-CM | POA: Diagnosis not present

## 2021-12-05 DIAGNOSIS — Z006 Encounter for examination for normal comparison and control in clinical research program: Secondary | ICD-10-CM

## 2021-12-05 DIAGNOSIS — E7800 Pure hypercholesterolemia, unspecified: Secondary | ICD-10-CM

## 2021-12-05 HISTORY — DX: Localized edema: R60.0

## 2021-12-05 NOTE — Telephone Encounter (Signed)
Please contact patient to schedule a follow up appointment for further refills.  I sent in 30 day supply.  Last Office visit with Christen Butter 02/09/2021  Thank you,  Okey Regal, CMA

## 2021-12-05 NOTE — Assessment & Plan Note (Signed)
Blood pressure has been in the 130s in the AM and 100s in the PM.  He has noted increased LE edema and has some shortness of breath.  He doesn't want to change his medication unless the swelling is harmful to him.  Continue amlodipine, doxazosin, HCTZ, and valsartan for now.

## 2021-12-05 NOTE — Telephone Encounter (Signed)
Patient has been scheduled

## 2021-12-05 NOTE — Assessment & Plan Note (Signed)
Negative stress test 07/2021.  He has known coronary calcification.  He has no anginal symptoms.  Continue aspirin, atorvastatin, and clopidogrel.  He is on DAPT for his PAD.

## 2021-12-05 NOTE — Assessment & Plan Note (Signed)
Continue atorvastatin

## 2021-12-05 NOTE — Telephone Encounter (Signed)
Patient had visit today.

## 2021-12-05 NOTE — Patient Instructions (Addendum)
Medication Instructions:  Your physician recommends that you continue on your current medications as directed. Please refer to the Current Medication list given to you today.   *If you need a refill on your cardiac medications before your next appointment, please call your pharmacy*  Lab Work: NONE   Testing/Procedures: Your physician has requested that you have an echocardiogram. Echocardiography is a painless test that uses sound waves to create images of your heart. It provides your doctor with information about the size and shape of your heart and how well your heart's chambers and valves are working. This procedure takes approximately one hour. There are no restrictions for this procedure. 12/14/2021 1:00 PM AT [facility]   Follow-Up: At [facility], you and your health needs are our priority.  As part of our continuing mission to provide you with exceptional heart care, we have created designated Provider Care Teams.  These Care Teams include your primary Cardiologist (physician) and Advanced Practice Providers (APPs -  Physician Assistants and Nurse Practitioners) who all work together to provide you with the care you need, when you need it.  We recommend signing up for the patient portal called "MyChart".  Sign up information is provided on this After Visit Summary.  MyChart is used to connect with patients for Virtual Visits (Telemedicine).  Patients are able to view lab/test results, encounter notes, upcoming appointments, etc.  Non-urgent messages can be sent to your provider as well.   To learn more about what you can do with MyChart, go to [email or web address].    Your next appointment:   01/11/2022 3:10 PM WITH CAITLIN W NP AT [facility] LOCATION

## 2021-12-05 NOTE — Progress Notes (Addendum)
Advanced Hypertension Clinic Follow-up- Telehealth:    Date:  12/05/2021   ID:  Laural Golden, DOB [birth year], MRN [id number]  PCP:  Samuel Bouche, NP  Cardiologist:  None   Because of Gardner Candle co-morbid illnesses, he is at least at moderate risk for complications without adequate follow up.  This format is felt to be most appropriate for this patient at this time.  All issues noted in this document were discussed and addressed.  A limited physical exam was performed with this format.  Please refer to the patient's chart for his consent to telehealth for [facility].  The patient was identified using 2 identifiers.  Patient Location: Home Provider Location: Office/Clinic  This visit was completed via video for the entire visit.   Evaluation Performed:  Follow-Up Visit  Referring MD: Samuel Bouche, NP   CC: Hypertension  History of Present Illness:    Johndaniel Majkowski is a 71 y.o. male with a hx of hypertension, critical limb ischemia, tobacco abuse, and sleep apnea (not consistently on CPAP) here for follow-up. He was initially seen 04/12/2021 to establish care in the Advanced Hypertension Clinic. He last saw Samuel Bouche, NP on 02/09/21 for hypertension. He checked his blood pressure 2-3 times daily and noticed the readings were higher in the morning and normal or low later in the day. He continued to take amlodipine 2.5mg  daily and Cardura 8mg  nightly.  He was referred to the hypertension clinic for evaluation of potential causes to the morning surge in his blood pressure. He sees vascular surgery for PAD. He had stenting of the R and L external iliacs on 01/2020 and has no claudication.   At his initial visit he reported frequent nocturia. Amlodipine was increased and switched to the evening to help with his elevated morning blood pressures. He was enrolled in our remote patient monitoring study. He followed up with our pharmacist and his blood pressures continued to be high in  the mornings but were controlled in the evenings. It was noted that his home blood pressure cuff was reading higher than the in-office blood pressure cuff.  They decided to increase amlodipine to 10 mg. On 05/2021 valsartan was added. He followed up 06/2021 and his BP was average was at goal at home, though he did have some hypotensive episodes. They recommended switching his nicotine gum to lozenges given his dental devices.  At his last in office visit his blood pressure was 173/89. Per his blood pressure log, his at home readings are typically higher in the mornings. He has checked using multiple machines [employer] and [facility]) in the readings tended to run higher on the [employer].  At that time he was taking amlodipine at night and in the morning, Valsartan in the morning, and doxazosin at night.  Valsartan was switched to the evening.  He noted exertional dyspnea and given that he had coronary calcification, he was referred for a nuclear stress test 07/2021 that revealed LVEF 71% and no ischemia.  He was unable to reach target heart rate on the treadmill and was of converted to [employer].  At the last visit, his morning blood pressures were running above goal, so HCTZ 12.5 mg was added. He saw Vascular 10/2021 and BP in the office was 161/79. He was noted to have mild ankle edema. Today, he states his blood pressure has been okay in general. He agrees his blood pressure has usually been around [id number] systolic in the mornings and 100's in the  evenings. His blood pressure has also been as high as 145/85 or as low as 120/75, but he is unable to determine any correlation as to why. He is taking 5 mg amlodipine BID. Also he endorses bilateral LE edema, most of the time worse in his right foot. He confirms that his edema will leave an imprint up to 7-10 inches vertically of his shin. On some days, including this morning his swelling is not as severe. He generally notices improvement in his  edema earlier in the day. He states that he is finding himself to be short of breath frequently. There is no improvement when he lies down. At 2:30 AM this morning he woke up and discovered he had bitten his tongue, causing significant bleeding. Lately he has been suffering from some headaches and other cold-like symptoms, which have not been relieved by tylenol. He denies any palpitations, or chest pain. No lightheadedness, syncope, orthopnea, or PND.  Morning BP WA 138/76, HR 80 [facility] 132/78,  Evening BP WA 101/53 [facility] 100/57  Daily Average WA 119/64.5 [facility] 116/67   Past Medical History:  Diagnosis Date   Atherosclerosis    Benign essential tremor    CAD in native artery 08/17/2021   Lower extremity edema 12/05/2021   Pure hypercholesterolemia 08/17/2021   RBBB 04/12/2021   Tobacco abuse 04/12/2021    Past Surgical History:  Procedure Laterality Date   ABDOMINAL AORTOGRAM W/LOWER EXTREMITY N/A 01/28/2020   Procedure: ABDOMINAL AORTOGRAM W/LOWER EXTREMITY;  Surgeon: Cephus Shelling, MD;  Location: [facility] INVASIVE CV LAB;  Service: Cardiovascular;  Laterality: N/A;   PERIPHERAL VASCULAR INTERVENTION Bilateral 01/28/2020   Procedure: PERIPHERAL VASCULAR INTERVENTION;  Surgeon: Cephus Shelling, MD;  Location: [facility] INVASIVE CV LAB;  Service: Cardiovascular;  Laterality: Bilateral;  external iliac stents    Current Medications: Current Meds  Medication Sig   AMBULATORY NON FORMULARY MEDICATION Continuous positive airway pressure (CPAP) machine set on AutoPAP (4-20 cmH2O), with all supplemental supplies as needed.   amLODipine (NORVASC) 5 MG tablet Take 1 tablet (5 mg total) by mouth in the morning and at bedtime.   Aspirin 81 MG CAPS Take 81 mg by mouth daily.   atorvastatin (LIPITOR) 10 MG tablet TAKE 1 TABLET BY MOUTH EVERY DAY   clopidogrel (PLAVIX) 75 MG tablet TAKE 1 TABLET BY MOUTH EVERY DAY   doxazosin (CARDURA) 8 MG tablet TAKE 1 TABLET BY MOUTH EVERYDAY AT  BEDTIME   hydrochlorothiazide (HYDRODIURIL) 12.5 MG tablet Take 1 tablet (12.5 mg total) by mouth daily.   Multiple Vitamin (MULTIVITAMIN WITH MINERALS) TABS tablet Take 1 tablet by mouth daily.   valsartan (DIOVAN) 80 MG tablet Take 80 mg by mouth at bedtime.     Allergies:   Patient has no known allergies.   Social History   Socioeconomic History   Marital status: Single    Spouse name: Not on file   Number of children: Not on file   Years of education: Not on file   Highest education level: Not on file  Occupational History    Comment: Retired.  Tobacco Use   Smoking status: Every Day    Packs/day: 1.00    Years: 30.00    Total pack years: 30.00    Types: Cigarettes   Smokeless tobacco: Never  Vaping Use   Vaping Use: Never used  Substance and Sexual Activity   Alcohol use: Yes    Alcohol/week: 8.0 - 10.0 standard drinks of alcohol    Types: 8 - 10  Standard drinks or equivalent per week   Drug use: Never   Sexual activity: Yes  Other Topics Concern   Not on file  Social History Narrative   Patient did not want to answer   Social Determinants of Health   Financial Resource Strain: Low Risk  (12/08/2020)   Overall Financial Resource Strain (CARDIA)    Difficulty of Paying Living Expenses: Not hard at all  Food Insecurity: No Food Insecurity (12/08/2020)   Hunger Vital Sign    Worried About Running Out of Food in the Last Year: Never true    Ran Out of Food in the Last Year: Never true  Transportation Needs: No Transportation Needs (12/08/2020)   PRAPARE - Hydrologist (Medical): No    Lack of Transportation (Non-Medical): No  Physical Activity: Inactive (12/08/2020)   Exercise Vital Sign    Days of Exercise per Week: 0 days    Minutes of Exercise per Session: 0 min  Stress: No Stress Concern Present (12/08/2020)   [place]    Feeling of Stress : Not at all  Social  Connections: Unknown (12/08/2020)   Social Connection and Isolation Panel [NHANES]    Frequency of Communication with Friends and Family: More than three times a week    Frequency of Social Gatherings with Friends and Family: Three times a week    Attends Religious Services: Patient refused    Active Member of Clubs or Organizations: Patient refused    Attends Archivist Meetings: Patient refused    Marital Status: Patient refused     Family History: The patient's family history includes Hyperlipidemia in his mother; Hypertension in his father and mother.  ROS:   Please see the history of present illness.    (+) Bilateral LE edema (+) Shortness of breath (+) Headaches All other systems reviewed and are negative.  EKGs/Labs/Other Studies Reviewed:    ABI  11/03/2021: Summary:  Right: Resting right ankle-brachial index indicates moderate right lower  extremity arterial disease. The right toe-brachial index is abnormal.   Left: Resting left ankle-brachial index is within normal range. No  evidence of significant left lower extremity arterial disease. The left  toe-brachial index is normal.   Abdominal Aorta Study  11/03/2021: Summary:  Stenosis:  Bilateral external iliac artery stents appear patent with no evidence of  stenosis.   Lexiscan Myoview 08/24/2021:   Findings are consistent with no prior ischemia. The study is low risk.   No ST deviation was noted.   Left ventricular function is normal. Nuclear stress EF: 71 %. The left ventricular ejection fraction is hyperdynamic (>65%). End diastolic cavity size is normal. End systolic cavity size is normal.   Prior study not available for comparison.   Patient was unable to achieve THR on the treadmill, only reaching 76% of MPHR. Study converted to Rake. Perfusion appears normal. Overall low risk study. LVEF 71%. Evaluate for possible chronotropic incompetence given RBBB since he did attempt 10 minutes of exercise, but  could not achieve target heart rate. No prior for comparison  CT Chest 01/19/21 -Lung-RADS 2, benign appearance or behavior. Continue annual screening with low-dose chest CT without contrast in 12 months. -Three-vessel coronary artery calcifications, aortic Atherosclerosis (ICD10-I70.0) and Emphysema (ICD10-J43.9).  LE Doppler 11/02/20 Right: Resting right ankle-brachial index indicates moderate right lower  extremity arterial disease. The right toe-brachial index is abnormal. RT  great toe pressure = 113 mmHg.  Left:  Resting left ankle-brachial index is within normal range. No  evidence of significant left lower extremity arterial disease. The left  toe-brachial index is normal. LT Great toe pressure = 175 mmHg.   Abdominal Aortogram 01/28/2020: Aortogram showed one right and three left renal arteries that were widely patent.  The infrarenal aorta was widely patent.  On the right he had a patent common iliac and hypogastric artery but a total occlusion of the right external iliac with reconstitution of the common femoral and then a flush right SFA occlusion and only profunda runoff on the right.  On the left he had a patent common iliac artery and a high-grade 80% calcified stenosis in the left external iliac that we initially had some difficulty crossing retrograde for sheath access.   After evaluating the images then accessed the right common femoral artery retrograde and was able to cross the right external iliac occlusion.  This was primarily stented with an 8 mm Innova postdilated with a 7 mm Mustang.  He now has a right femoral pulse. He also endorses claudication symptoms in the left lower extremity and given access issues with the left external iliac lesion I stentede this with a 8 mm x 40 mm self-expanding Innova and post-dilated this with a 7 mm angioplasty balloon.  The stent was too short and had to place a second 8 mm x 20 mm self expanding Innova.   Bilateral lower extremity runoff  after iliac stent placement on the right shows flush SFA occlusion with reconstitution of the distal SFA patent above and below-knee popliteal artery and apparent three-vessel runoff.  On the left he has a patent SFA with some calcific disease but no flow-limiting stenosis as well as a patent above below-knee popliteal artery and two-vessel runoff in the peroneal and posterior tibial.   EKG:  EKG is personally reviewed. 08/17/2021: EKG was not ordered. 04/12/21: Sinus rhythm, rate 71 bpm; RBBB  Recent Labs: 02/09/2021: ALT 18 07/15/2021: BUN 14; Creat 0.85; Hemoglobin 14.1; Platelets 219; Potassium 4.1; Sodium 134   Recent Lipid Panel    Component Value Date/Time   CHOL 167 02/09/2021 0000   TRIG 70 02/09/2021 0000   HDL 76 02/09/2021 0000   CHOLHDL 2.2 02/09/2021 0000   LDLCALC 76 02/09/2021 0000    Physical Exam:    BP 140/84   Pulse 83   Ht 6' (1.829 m)   Wt 185 lb (83.9 kg)   BMI 25.09 kg/m  GENERAL: Well-appearing.  No acute distress. HEENT: Pupils equal round.  Oral mucosa unremarkable NECK:  No jugular venous distention, no visible thyromegaly EXT:  No edema, no cyanosis no clubbing SKIN:  No rashes no nodules NEURO:  Speech fluent.  Cranial nerves grossly intact.  Moves all 4 extremities freely PSYCH:  Cognitively intact, oriented to person place and time   ASSESSMENT/PLAN:    Essential hypertension Blood pressure has been in the 130s in the AM and 100s in the PM.  He has noted increased LE edema and has some shortness of breath.  He doesn't want to change his medication unless the swelling is harmful to him.  Continue amlodipine, doxazosin, HCTZ, and valsartan for now.  CAD in native artery Negative stress test 07/2021.  He has known coronary calcification.  He has no anginal symptoms.  Continue aspirin, atorvastatin, and clopidogrel.  He is on DAPT for his PAD.  Critical lower limb ischemia He follows with vascular.  He is on appropriate secondary prevention with  aspirin, clopidogrel,  and atorvastatin.  Pure hypercholesterolemia Continue atorvastatin.  Lower extremity edema Mr. Ram notes lower extremity edema.  He also has shortness of breath.  This is complicated by the fact that he now has an upper respiratory infection.  He has known underlying lung disease and notes that his oxygen saturations have been low lately.  He notes increased shortness of breath.  We will get an echocardiogram to make sure there is no evidence of any right-sided heart disease or heart failure.  Recommend that he see his PCP about his shortness of breath.  We did discuss lowering his amlodipine and increasing hydrochlorothiazide.  He would not like to make any changes at this time.    Screening for Secondary Hypertension:     04/12/2021   11:35 AM  Causes  Drugs/Herbals Screened     - Comments Limits sodium intake.  Limited caffeine.  EtOH 1-2/night. 1ppd    Relevant Labs/Studies:    Latest Ref Rng & Units 07/15/2021    3:18 PM 02/09/2021   12:00 AM 01/28/2020    8:41 AM  Basic Labs  Sodium 135 - 146 mmol/L 134  139  138   Potassium 3.5 - 5.3 mmol/L 4.1  4.5  4.1   Creatinine 0.70 - 1.28 mg/dL 5.18  8.41  6.60        Latest Ref Rng & Units 12/09/2019    9:15 AM  Thyroid   TSH 0.40 - 4.50 mIU/L 1.53      Disposition:    FU with Jalacia Mattila C. Duke Salvia, MD, [facility] in 1-2 months with virtual visit.   Medication Adjustments/Labs and Tests Ordered: Current medicines are reviewed at length with the patient today.  Concerns regarding medicines are outlined above.   Orders Placed This Encounter  Procedures   Cantril's Ladder Assessment   ECHOCARDIOGRAM COMPLETE   No orders of the defined types were placed in this encounter.  Time:   Today, I have spent 23 minutes with the patient with telehealth technology discussing the above problems.    I,Mathew Stumpf,acting as a Neurosurgeon for Chilton Si, MD.,have documented all relevant documentation on the behalf of  Chilton Si, MD,as directed by  Chilton Si, MD while in the presence of Chilton Si, MD.  I, Treyana Sturgell C. Duke Salvia, MD have reviewed all documentation for this visit.  The documentation of the exam, diagnosis, procedures, and orders on 12/05/2021 are all accurate and complete.   Signed, Chilton Si, MD  12/05/2021 10:38 AM    [facility]

## 2021-12-05 NOTE — Research (Signed)
I saw pt today after Dr. Mexico's follow up visit. Pt is in Dr. Woodlawn's Virtual Care HTN Study. Pt filled out research survey. Pt was enrolled in Group 1. Pt has successfully completed the Virtual Care HTN Study.   

## 2021-12-05 NOTE — Telephone Encounter (Signed)
BP Log

## 2021-12-05 NOTE — Assessment & Plan Note (Signed)
He follows with vascular.  He is on appropriate secondary prevention with aspirin, clopidogrel, and atorvastatin.

## 2021-12-05 NOTE — Assessment & Plan Note (Signed)
Jason Carson notes lower extremity edema.  He also has shortness of breath.  This is complicated by the fact that he now has an upper respiratory infection.  He has known underlying lung disease and notes that his oxygen saturations have been low lately.  He notes increased shortness of breath.  We will get an echocardiogram to make sure there is no evidence of any right-sided heart disease or heart failure.  Recommend that he see his PCP about his shortness of breath.  We did discuss lowering his amlodipine and increasing hydrochlorothiazide.  He would not like to make any changes at this time.

## 2021-12-07 ENCOUNTER — Telehealth: Payer: Self-pay | Admitting: Emergency Medicine

## 2021-12-07 ENCOUNTER — Other Ambulatory Visit: Payer: Self-pay

## 2021-12-07 ENCOUNTER — Emergency Department (HOSPITAL_BASED_OUTPATIENT_CLINIC_OR_DEPARTMENT_OTHER): Payer: Medicare Other

## 2021-12-07 ENCOUNTER — Encounter (HOSPITAL_BASED_OUTPATIENT_CLINIC_OR_DEPARTMENT_OTHER): Payer: Self-pay | Admitting: Emergency Medicine

## 2021-12-07 ENCOUNTER — Inpatient Hospital Stay (HOSPITAL_BASED_OUTPATIENT_CLINIC_OR_DEPARTMENT_OTHER)
Admission: EM | Admit: 2021-12-07 | Discharge: 2021-12-12 | DRG: 177 | Disposition: A | Discharge status: Home / Self Care | Payer: Medicare Other | Attending: Internal Medicine | Admitting: Internal Medicine

## 2021-12-07 ENCOUNTER — Ambulatory Visit: Payer: Medicare Other

## 2021-12-07 DIAGNOSIS — I700 Atherosclerosis of aorta: Secondary | ICD-10-CM | POA: Diagnosis not present

## 2021-12-07 DIAGNOSIS — I4510 Unspecified right bundle-branch block: Secondary | ICD-10-CM | POA: Diagnosis present

## 2021-12-07 DIAGNOSIS — Z79899 Other long term (current) drug therapy: Secondary | ICD-10-CM

## 2021-12-07 DIAGNOSIS — Z8249 Family history of ischemic heart disease and other diseases of the circulatory system: Secondary | ICD-10-CM

## 2021-12-07 DIAGNOSIS — Z83438 Family history of other disorder of lipoprotein metabolism and other lipidemia: Secondary | ICD-10-CM

## 2021-12-07 DIAGNOSIS — K5900 Constipation, unspecified: Secondary | ICD-10-CM

## 2021-12-07 DIAGNOSIS — E871 Hypo-osmolality and hyponatremia: Secondary | ICD-10-CM | POA: Diagnosis not present

## 2021-12-07 DIAGNOSIS — R9431 Abnormal electrocardiogram [ECG] [EKG]: Secondary | ICD-10-CM | POA: Diagnosis not present

## 2021-12-07 DIAGNOSIS — J151 Pneumonia due to Pseudomonas: Secondary | ICD-10-CM | POA: Diagnosis not present

## 2021-12-07 DIAGNOSIS — I10 Essential (primary) hypertension: Secondary | ICD-10-CM | POA: Diagnosis present

## 2021-12-07 DIAGNOSIS — E785 Hyperlipidemia, unspecified: Secondary | ICD-10-CM

## 2021-12-07 DIAGNOSIS — J439 Emphysema, unspecified: Secondary | ICD-10-CM | POA: Diagnosis present

## 2021-12-07 DIAGNOSIS — I2510 Atherosclerotic heart disease of native coronary artery without angina pectoris: Secondary | ICD-10-CM | POA: Diagnosis not present

## 2021-12-07 DIAGNOSIS — Z7982 Long term (current) use of aspirin: Secondary | ICD-10-CM

## 2021-12-07 DIAGNOSIS — J189 Pneumonia, unspecified organism: Secondary | ICD-10-CM | POA: Diagnosis not present

## 2021-12-07 DIAGNOSIS — J449 Chronic obstructive pulmonary disease, unspecified: Secondary | ICD-10-CM

## 2021-12-07 DIAGNOSIS — D72829 Elevated white blood cell count, unspecified: Secondary | ICD-10-CM

## 2021-12-07 DIAGNOSIS — E7800 Pure hypercholesterolemia, unspecified: Secondary | ICD-10-CM | POA: Diagnosis present

## 2021-12-07 DIAGNOSIS — J9601 Acute respiratory failure with hypoxia: Secondary | ICD-10-CM | POA: Diagnosis not present

## 2021-12-07 DIAGNOSIS — R918 Other nonspecific abnormal finding of lung field: Secondary | ICD-10-CM | POA: Diagnosis not present

## 2021-12-07 DIAGNOSIS — Z20822 Contact with and (suspected) exposure to covid-19: Secondary | ICD-10-CM | POA: Diagnosis present

## 2021-12-07 DIAGNOSIS — D7589 Other specified diseases of blood and blood-forming organs: Secondary | ICD-10-CM | POA: Diagnosis present

## 2021-12-07 DIAGNOSIS — G4733 Obstructive sleep apnea (adult) (pediatric): Secondary | ICD-10-CM

## 2021-12-07 DIAGNOSIS — J441 Chronic obstructive pulmonary disease with (acute) exacerbation: Secondary | ICD-10-CM

## 2021-12-07 DIAGNOSIS — Z7902 Long term (current) use of antithrombotics/antiplatelets: Secondary | ICD-10-CM

## 2021-12-07 DIAGNOSIS — I739 Peripheral vascular disease, unspecified: Secondary | ICD-10-CM | POA: Diagnosis not present

## 2021-12-07 DIAGNOSIS — G250 Essential tremor: Secondary | ICD-10-CM | POA: Diagnosis present

## 2021-12-07 DIAGNOSIS — Z720 Tobacco use: Secondary | ICD-10-CM | POA: Diagnosis present

## 2021-12-07 DIAGNOSIS — F17210 Nicotine dependence, cigarettes, uncomplicated: Secondary | ICD-10-CM | POA: Diagnosis present

## 2021-12-07 DIAGNOSIS — R0602 Shortness of breath: Secondary | ICD-10-CM | POA: Diagnosis not present

## 2021-12-07 LAB — CBC WITH DIFFERENTIAL/PLATELET
Abs Immature Granulocytes: 0.03 10*3/uL (ref 0.00–0.07)
Basophils Absolute: 0 10*3/uL (ref 0.0–0.1)
Basophils Relative: 0 %
Eosinophils Absolute: 0 10*3/uL (ref 0.0–0.5)
Eosinophils Relative: 1 %
HCT: 37.9 % — ABNORMAL LOW (ref 39.0–52.0)
Hemoglobin: 14.2 g/dL (ref 13.0–17.0)
Immature Granulocytes: 0 %
Lymphocytes Relative: 12 %
Lymphs Abs: 1.1 10*3/uL (ref 0.7–4.0)
MCH: 35.9 pg — ABNORMAL HIGH (ref 26.0–34.0)
MCHC: 37.5 g/dL — ABNORMAL HIGH (ref 30.0–36.0)
MCV: 95.9 fL (ref 80.0–100.0)
Monocytes Absolute: 1 10*3/uL (ref 0.1–1.0)
Monocytes Relative: 11 %
Neutro Abs: 6.6 10*3/uL (ref 1.7–7.7)
Neutrophils Relative %: 76 %
Platelets: 221 10*3/uL (ref 150–400)
RBC: 3.95 MIL/uL — ABNORMAL LOW (ref 4.22–5.81)
RDW: 10.8 % — ABNORMAL LOW (ref 11.5–15.5)
WBC: 8.7 10*3/uL (ref 4.0–10.5)
nRBC: 0 % (ref 0.0–0.2)

## 2021-12-07 LAB — COMPREHENSIVE METABOLIC PANEL
ALT: 21 U/L (ref 0–44)
AST: 25 U/L (ref 15–41)
Albumin: 3.5 g/dL (ref 3.5–5.0)
Alkaline Phosphatase: 73 U/L (ref 38–126)
Anion gap: 9 (ref 5–15)
BUN: 14 mg/dL (ref 8–23)
CO2: 24 mmol/L (ref 22–32)
Calcium: 8.7 mg/dL — ABNORMAL LOW (ref 8.9–10.3)
Chloride: 92 mmol/L — ABNORMAL LOW (ref 98–111)
Creatinine, Ser: 0.71 mg/dL (ref 0.61–1.24)
GFR, Estimated: >60 mL/min (ref >60)
Glucose, Bld: 126 mg/dL — ABNORMAL HIGH (ref 70–99)
Potassium: 3.7 mmol/L (ref 3.5–5.1)
Sodium: 125 mmol/L — ABNORMAL LOW (ref 135–145)
Total Bilirubin: 0.7 mg/dL (ref 0.3–1.2)
Total Protein: 6.6 g/dL (ref 6.5–8.1)

## 2021-12-07 LAB — TROPONIN I (HIGH SENSITIVITY)
Troponin I (High Sensitivity): 9 ng/L (ref <18)
Troponin I (High Sensitivity): 8 ng/L (ref <18)

## 2021-12-07 LAB — BRAIN NATRIURETIC PEPTIDE: B Natriuretic Peptide: 50.2 pg/mL (ref 0.0–100.0)

## 2021-12-07 LAB — SARS CORONAVIRUS 2 BY RT PCR: SARS Coronavirus 2 by RT PCR: NEGATIVE

## 2021-12-07 MED ORDER — SODIUM CHLORIDE 0.9 % IV SOLN
1.0000 g | Freq: Once | INTRAVENOUS | Status: AC
  Administered 2021-12-07: 1 g via INTRAVENOUS
  Filled 2021-12-07: qty 10

## 2021-12-07 MED ORDER — IPRATROPIUM-ALBUTEROL 0.5-2.5 (3) MG/3ML IN SOLN
3.0000 mL | Freq: Once | RESPIRATORY_TRACT | Status: AC
Start: 2021-12-07 — End: 2021-12-07
  Administered 2021-12-07: 3 mL via RESPIRATORY_TRACT
  Filled 2021-12-07: qty 3

## 2021-12-07 MED ORDER — AZITHROMYCIN 500 MG IV SOLR
500.0000 mg | Freq: Once | INTRAVENOUS | Status: AC
  Administered 2021-12-07: 500 mg via INTRAVENOUS
  Filled 2021-12-07: qty 5

## 2021-12-07 MED ORDER — METHYLPREDNISOLONE SODIUM SUCC 125 MG IJ SOLR
125.0000 mg | Freq: Once | INTRAMUSCULAR | Status: AC
Start: 2021-12-07 — End: 2021-12-07
  Administered 2021-12-07: 125 mg via INTRAVENOUS
  Filled 2021-12-07: qty 2

## 2021-12-07 MED ORDER — ALBUTEROL (5 MG/ML) CONTINUOUS INHALATION SOLN
INHALATION_SOLUTION | RESPIRATORY_TRACT | Status: AC
  Administered 2021-12-07: 2.5 mg
  Filled 2021-12-07: qty 0.5

## 2021-12-07 MED ORDER — IPRATROPIUM-ALBUTEROL 0.5-2.5 (3) MG/3ML IN SOLN
RESPIRATORY_TRACT | Status: AC
  Administered 2021-12-07: 3 mL
  Filled 2021-12-07: qty 3

## 2021-12-07 MED ORDER — IOHEXOL 350 MG/ML SOLN
75.0000 mL | Freq: Once | INTRAVENOUS | Status: AC | PRN
  Administered 2021-12-07: 75 mL via INTRAVENOUS

## 2021-12-07 MED ORDER — IPRATROPIUM-ALBUTEROL 0.5-2.5 (3) MG/3ML IN SOLN
3.0000 mL | Freq: Four times a day (QID) | RESPIRATORY_TRACT | Status: DC | PRN
  Administered 2021-12-08: 3 mL via RESPIRATORY_TRACT
  Filled 2021-12-07: qty 3

## 2021-12-07 NOTE — ED Provider Notes (Signed)
[facility] EMERGENCY DEPARTMENT Provider Note   CSN: [id number] Arrival date & time: 12/07/21  1558    History  Chief Complaint  Patient presents with   Shortness of Breath    Jason Carson is a 71 y.o. male history of PAD, hypertension, OSA on CPAP, tobacco use, hypercholesterolemia here for evaluation of shortness of breath.  Patient states he has some chronic dyspnea on exertion.   About a week ago last Thursday he developed fever up to 101, nonproductive cough and significant shortness of breath.  Shortness of breath worse when ambulating.  Has been checking his pulse ox at home with oxygen saturations in the low to mid 80s with exertion, high 88-89% at rest.  He has no known history of hypoxia does not use supplemental oxygen at home.  He denies any history of COPD or asthma.  No chest pain.  States he has chronic lower extremity swelling which is unchanged from his baseline (mentioned in vascular note from 11/03/21).  No PND or orthopnea.  No sick contacts, back pain, abdominal pain, new lower extremity pain, redness.   Fever Thursday- Sunday. Monday low grade temp. None yesterday or today.  HPI     Home Medications Prior to Admission medications   Medication Sig Start Date End Date Taking? Authorizing Provider  AMBULATORY NON FORMULARY MEDICATION Continuous positive airway pressure (CPAP) machine set on AutoPAP (4-20 cmH2O), with all supplemental supplies as needed. 10/26/20   Christen Butter, NP  amLODipine (NORVASC) 5 MG tablet Take 1 tablet (5 mg total) by mouth in the morning and at bedtime. 06/20/21   Chilton Si, MD  Aspirin 81 MG CAPS Take 81 mg by mouth daily.    [provider]  atorvastatin (LIPITOR) 10 MG tablet TAKE 1 TABLET BY MOUTH EVERY DAY 09/26/21   Maeola Harman, MD  clopidogrel (PLAVIX) 75 MG tablet TAKE 1 TABLET BY MOUTH EVERY DAY 09/26/21   Maeola Harman, MD  doxazosin (CARDURA) 8 MG tablet Take 1 tablet (8 mg total) by  mouth at bedtime. NEEDS APPOINTMENT FOR FURTHER REFILLS. 12/05/21   Christen Butter, NP  hydrochlorothiazide (HYDRODIURIL) 12.5 MG tablet Take 1 tablet (12.5 mg total) by mouth daily. 10/07/21   Chilton Si, MD  Multiple Vitamin (MULTIVITAMIN WITH MINERALS) TABS tablet Take 1 tablet by mouth daily.    [provider]  valsartan (DIOVAN) 80 MG tablet Take 80 mg by mouth at bedtime.    [provider]      Allergies    Patient has no known allergies.    Review of Systems   Review of Systems  Constitutional:  Positive for activity change, fatigue and fever.  HENT: Negative.    Respiratory:  Positive for cough and shortness of breath.   Cardiovascular:  Positive for chest pain and leg swelling (chronic). Negative for palpitations.  Gastrointestinal: Negative.   Genitourinary: Negative.   Neurological:  Positive for weakness (generalized).  All other systems reviewed and are negative.   Physical Exam Updated Vital Signs BP (!) 152/89   Pulse 81   Temp 98.6 F (37 C) (Oral)   Resp 19   Ht 6' (1.829 m)   Wt 83.9 kg   SpO2 92%   BMI 25.09 kg/m  Physical Exam Vitals and nursing note reviewed.  Constitutional:      General: He is not in acute distress.    Appearance: He is well-developed. He is ill-appearing. He is not toxic-appearing or diaphoretic.  HENT:  Head: Normocephalic and atraumatic.  Eyes:     Pupils: Pupils are equal, round, and reactive to light.  Cardiovascular:     Rate and Rhythm: Normal rate and regular rhythm.     Pulses: Normal pulses.     Heart sounds: Normal heart sounds.  Pulmonary:     Effort: Pulmonary effort is normal. Tachypnea present. No respiratory distress.     Breath sounds: Wheezing and rhonchi present.     Comments: Diffuse wheezing throughout, rhonchi right lower lobe.  Wet cough in room Abdominal:     General: There is no distension.     Palpations: Abdomen is soft.  Musculoskeletal:        General: Normal range of  motion.     Cervical back: Normal range of motion and neck supple.     Right lower leg: No tenderness. No edema.     Left lower leg: No tenderness. No edema.     Comments: No bony tenderness, compartments soft.  Homans sign negative  Skin:    General: Skin is warm and dry.     Capillary Refill: Capillary refill takes less than 2 seconds.     Comments: No rashes or lesions  Neurological:     General: No focal deficit present.     Mental Status: He is alert and oriented to person, place, and time.     ED Results / Procedures / Treatments   Labs (all labs ordered are listed, but only abnormal results are displayed) Labs Reviewed  CBC WITH DIFFERENTIAL/PLATELET - Abnormal; Notable for the following components:      Result Value   RBC 3.95 (*)    HCT 37.9 (*)    MCH 35.9 (*)    MCHC 37.5 (*)    RDW 10.8 (*)    All other components within normal limits  COMPREHENSIVE METABOLIC PANEL - Abnormal; Notable for the following components:   Sodium 125 (*)    Chloride 92 (*)    Glucose, Bld 126 (*)    Calcium 8.7 (*)    All other components within normal limits  SARS CORONAVIRUS 2 BY RT PCR  BRAIN NATRIURETIC PEPTIDE  TROPONIN I (HIGH SENSITIVITY)  TROPONIN I (HIGH SENSITIVITY)    EKG EKG Interpretation  Date/Time:  Wednesday December 07 2021 16:14:04 EDT Ventricular Rate:  84 PR Interval:  150 QRS Duration: 142 QT Interval:  392 QTC Calculation: 463 R Axis:   7 Text Interpretation: Normal sinus rhythm Right bundle branch block Abnormal ECG When compared with ECG of 28-Jan-2020 09:06, PREVIOUS ECG IS PRESENT Confirmed by Thamas Jaegers ([id number]) on 12/07/2021 5:20:19 PM  Radiology CT Angio Chest PE W/Cm &/Or Wo Cm  Result Date: 12/07/2021 CLINICAL DATA:  Pulmonary embolism (PE) suspected, high prob. Fever, cough, shortness of breath EXAM: CT ANGIOGRAPHY CHEST WITH CONTRAST TECHNIQUE: Multidetector CT imaging of the chest was performed using the standard protocol during bolus  administration of intravenous contrast. Multiplanar CT image reconstructions and MIPs were obtained to evaluate the vascular anatomy. RADIATION DOSE REDUCTION: This exam was performed according to the departmental dose-optimization program which includes automated exposure control, adjustment of the mA and/or kV according to patient size and/or use of iterative reconstruction technique. CONTRAST:  44mL OMNIPAQUE IOHEXOL 350 MG/ML SOLN COMPARISON:  01/19/2021 FINDINGS: Cardiovascular: No filling defects in the pulmonary arteries to suggest pulmonary emboli. Heart is normal size. Aorta is normal caliber. Coronary artery and aortic calcifications. Mediastinum/Nodes: No mediastinal, hilar, or axillary adenopathy. Trachea and esophagus are unremarkable.  Thyroid unremarkable. Lungs/Pleura: Nodular opacity in the superior segment of the right lower lobe 2 cm. This could be infectious/inflammatory as there are other ground-glass opacities also in the right lower lobe. Left lung clear. No effusions. Upper Abdomen: No acute findings Musculoskeletal: Chest wall soft tissues are unremarkable. No acute bony abnormality. Review of the MIP images confirms the above findings. IMPRESSION: No evidence of pulmonary embolus. Coronary artery disease. Patchy ground-glass and nodular opacities in the right lower lobe. Favor pneumonia. Followup chest CT is recommended in 3-4 weeks following trial of antibiotic therapy to ensure resolution and exclude underlying malignancy. Aortic Atherosclerosis (ICD10-I70.0). Electronically Signed   By: Charlett Nose M.D.   On: 12/07/2021 19:29   DG Chest 2 View  Result Date: 12/07/2021 CLINICAL DATA:  Shortness of breath. EXAM: CHEST - 2 VIEW COMPARISON:  CT chest 01/19/2021. FINDINGS: Mild streaky left basilar opacities, favor atelectasis. Linear hazy opacification in the mid lungs bilaterally. Hyperinflation. No visible pleural effusions or pneumothorax. Biapical pleuroparenchymal scarring.  Cardiomediastinal silhouette is within normal limits. No acute osseous abnormality. IMPRESSION: 1. Mild streaky left basilar opacities, favor atelectasis. 2. Linear hazy opacification in the mid lungs bilaterally is favored to represent attenuation from overlapping soft tissues. 3. Hyperinflation, compatible with emphysema. Electronically Signed   By: Feliberto Harts M.D.   On: 12/07/2021 16:50    Procedures .Critical Care  Performed by: Linwood Dibbles, PA-C Authorized by: Linwood Dibbles, PA-C   Critical care provider statement:    Critical care time (minutes):  35   Critical care was necessary to treat or prevent imminent or life-threatening deterioration of the following conditions:  Respiratory failure and metabolic crisis   Critical care was time spent personally by me on the following activities:  Development of treatment plan with patient or surrogate, discussions with consultants, evaluation of patient's response to treatment, examination of patient, ordering and review of laboratory studies, ordering and review of radiographic studies, ordering and performing treatments and interventions, pulse oximetry, re-evaluation of patient's condition and review of old charts     Medications Ordered in ED Medications  azithromycin (ZITHROMAX) 500 mg in sodium chloride 0.9 % 250 mL IVPB (has no administration in time range)  ipratropium-albuterol (DUONEB) 0.5-2.5 (3) MG/3ML nebulizer solution (3 mLs  Given 12/07/21 1614)  albuterol (VENTOLIN) (5 MG/ML) 0.5% continuous inhalation solution (2.5 mg  Given 12/07/21 1614)  ipratropium-albuterol (DUONEB) 0.5-2.5 (3) MG/3ML nebulizer solution 3 mL (3 mLs Nebulization Given 12/07/21 1745)  methylPREDNISolone sodium succinate (SOLU-MEDROL) 125 mg/2 mL injection 125 mg (125 mg Intravenous Given 12/07/21 1745)  cefTRIAXone (ROCEPHIN) 1 g in sodium chloride 0.9 % 100 mL IVPB (1 g Intravenous New Bag/Given 12/07/21 1836)  iohexol (OMNIPAQUE) 350 MG/ML  injection 75 mL (75 mLs Intravenous Contrast Given 12/07/21 1906)    ED Course/ Medical Decision Making/ A&P    71 year old history of PAD, hypertension, sleep apnea, tobacco use here for evaluation of 1 week of fever, hypoxia, shortness of breath.  Does not appear grossly fluid overloaded on exam.  He has diffuse wheeze as well as rhonchi to right lower lobe.  No known history of COPD, asthma.  Patient hypoxic here requiring 2 L via nasal cannula which he does not typically use at home.  Given fever last week suspect some degree of CAP, also with significant wheeze will treat with steroids, breathing treatment.  Of note prior to my evaluation patient had ready been treated by respiratory with DuoNebs.  Labs and imaging personally viewed and  interpreted:  X-ray chest without pneumonia, cardiomegaly, pulm edema, pneumothorax COVID-negative CBC without leukocytosis Metabolic panel hyponatremia 125 glucose 126 BNP 50 Troponin 9--8  Patient ambulated with respiratory therapist and dropped to 83% with significant dyspnea per tech.  Placed back on 2 L via nasal cannula.  Initially CT scanner unavailable at this facility.  He was treated with Rocephin and azithromycin for likely pneumonia given rhonchi at [facility].  Will give additional breathing treatment and steroids given wheeze.  I was made available at CT scanner is now up and working.  We will get CTA chest to rule out PE, assess for better pneumonia given rhonchi on exam.  CTA chest shows right lower lobe pneumonia.    Discussed results with patient in room.  I recommended admission for acute hypoxic respiratory failure likely due to pneumonia as well as possible new onset wheeze/emphysema given chest x-ray findings.  Patient agreeable for admission   CONSULT with Dr. Margo Aye with [facility] who agrees to accept patient in transfer for admission  The patient appears reasonably stabilized for admission considering the current resources, flow, and  capabilities available in the ED at this time, and I doubt any other [facility] requiring further screening and/or treatment in the ED prior to admission.                             Medical Decision Making Amount and/or Complexity of Data Reviewed External Data Reviewed: labs, radiology, ECG and notes. Labs: ordered. Decision-making details documented in ED Course. Radiology: ordered and independent interpretation performed. Decision-making details documented in ED Course. ECG/medicine tests: ordered and independent interpretation performed. Decision-making details documented in ED Course.  Risk OTC drugs. Prescription drug management. Parenteral controlled substances. Decision regarding hospitalization. Diagnosis or treatment significantly limited by social determinants of health.          Final Clinical Impression(s) / ED Diagnoses Final diagnoses:  Acute respiratory failure with hypoxia (HCC)  Hyponatremia  Community acquired pneumonia of right lower lobe of lung    Rx / DC Orders ED Discharge Orders     None         Shantese Raven A, PA-C 12/07/21 2056    Cheryll Cockayne, MD 12/07/21 [phone]

## 2021-12-07 NOTE — Progress Notes (Signed)
Patient states that he wears his CPAP most of the time.  Patient stated that he did want to go on CPAP now and is more comfortable sitting upright and being on the nasal cannula.  Patient stated he more comfortable at home sitting in his recliner and has not worn his CPAP the past couple of day.  Patient stated that if he changed his mind he would the call bell and the staff know.  RT will continue to monitor.

## 2021-12-07 NOTE — ED Notes (Signed)
Patient ambulated to restroom, SAT 83-84%. Neb treatment then given. Rt to monitor

## 2021-12-07 NOTE — ED Triage Notes (Signed)
Pt c/o intermittent fevers, dry cough,headache, and shob since Thursday. Using pulse ox at home with o2 high 80s at rest, and decreases to mid-80s with exertion. RT in triage to assess.

## 2021-12-07 NOTE — Telephone Encounter (Signed)
Pt made an appointment at 3pm today at [facility] for SOB, low O2sats, fever of 101. Chart reviewed w/ Dr Cathren Harsh. Based on patient's history  & current symptoms, pt is to go to the nearest ED ASAP. For a higher level of care. Pt verbalized an understanding & states he will go to [facility] or [facility] in case he has to be admitted.  Pt states the highest his O2 sat has been is 90% after sitting for a bit. Pt states in reason for visit, "Cough - Fever 100-101 Thur-Sun, down Mon, none Tue or today. Dry cough, decreased Tuesday. Shortness of breath throughout. O2 % 85-88 Friday - Monday, won't rise above 90% even sitting still. Walking 50 feet, folding clothes, etc. goes to 84-85% quickly." No other questions at this time. Bill thanked Charity fundraiser for the call.

## 2021-12-08 ENCOUNTER — Encounter (HOSPITAL_COMMUNITY): Payer: Self-pay | Admitting: Internal Medicine

## 2021-12-08 DIAGNOSIS — G4733 Obstructive sleep apnea (adult) (pediatric): Secondary | ICD-10-CM

## 2021-12-08 DIAGNOSIS — R0602 Shortness of breath: Secondary | ICD-10-CM | POA: Diagnosis not present

## 2021-12-08 DIAGNOSIS — I10 Essential (primary) hypertension: Secondary | ICD-10-CM

## 2021-12-08 DIAGNOSIS — J189 Pneumonia, unspecified organism: Secondary | ICD-10-CM | POA: Diagnosis not present

## 2021-12-08 DIAGNOSIS — E871 Hypo-osmolality and hyponatremia: Secondary | ICD-10-CM

## 2021-12-08 DIAGNOSIS — I739 Peripheral vascular disease, unspecified: Secondary | ICD-10-CM | POA: Diagnosis present

## 2021-12-08 DIAGNOSIS — Z9989 Dependence on other enabling machines and devices: Secondary | ICD-10-CM | POA: Diagnosis not present

## 2021-12-08 DIAGNOSIS — D72829 Elevated white blood cell count, unspecified: Secondary | ICD-10-CM | POA: Diagnosis not present

## 2021-12-08 DIAGNOSIS — Z7982 Long term (current) use of aspirin: Secondary | ICD-10-CM | POA: Diagnosis not present

## 2021-12-08 DIAGNOSIS — J441 Chronic obstructive pulmonary disease with (acute) exacerbation: Secondary | ICD-10-CM | POA: Diagnosis not present

## 2021-12-08 DIAGNOSIS — G250 Essential tremor: Secondary | ICD-10-CM | POA: Diagnosis present

## 2021-12-08 DIAGNOSIS — Z79899 Other long term (current) drug therapy: Secondary | ICD-10-CM | POA: Diagnosis not present

## 2021-12-08 DIAGNOSIS — D7589 Other specified diseases of blood and blood-forming organs: Secondary | ICD-10-CM

## 2021-12-08 DIAGNOSIS — K5900 Constipation, unspecified: Secondary | ICD-10-CM | POA: Diagnosis not present

## 2021-12-08 DIAGNOSIS — Z8249 Family history of ischemic heart disease and other diseases of the circulatory system: Secondary | ICD-10-CM | POA: Diagnosis not present

## 2021-12-08 DIAGNOSIS — E7800 Pure hypercholesterolemia, unspecified: Secondary | ICD-10-CM | POA: Diagnosis present

## 2021-12-08 DIAGNOSIS — F17210 Nicotine dependence, cigarettes, uncomplicated: Secondary | ICD-10-CM | POA: Diagnosis present

## 2021-12-08 DIAGNOSIS — E785 Hyperlipidemia, unspecified: Secondary | ICD-10-CM | POA: Diagnosis not present

## 2021-12-08 DIAGNOSIS — Z20822 Contact with and (suspected) exposure to covid-19: Secondary | ICD-10-CM | POA: Diagnosis present

## 2021-12-08 DIAGNOSIS — I4510 Unspecified right bundle-branch block: Secondary | ICD-10-CM | POA: Diagnosis present

## 2021-12-08 DIAGNOSIS — J9601 Acute respiratory failure with hypoxia: Secondary | ICD-10-CM | POA: Diagnosis present

## 2021-12-08 DIAGNOSIS — I2510 Atherosclerotic heart disease of native coronary artery without angina pectoris: Secondary | ICD-10-CM | POA: Diagnosis present

## 2021-12-08 DIAGNOSIS — Z720 Tobacco use: Secondary | ICD-10-CM | POA: Diagnosis not present

## 2021-12-08 DIAGNOSIS — J151 Pneumonia due to Pseudomonas: Secondary | ICD-10-CM | POA: Diagnosis present

## 2021-12-08 DIAGNOSIS — J439 Emphysema, unspecified: Secondary | ICD-10-CM | POA: Diagnosis present

## 2021-12-08 DIAGNOSIS — Z7902 Long term (current) use of antithrombotics/antiplatelets: Secondary | ICD-10-CM | POA: Diagnosis not present

## 2021-12-08 DIAGNOSIS — Z83438 Family history of other disorder of lipoprotein metabolism and other lipidemia: Secondary | ICD-10-CM | POA: Diagnosis not present

## 2021-12-08 LAB — CBC WITH DIFFERENTIAL/PLATELET
Abs Immature Granulocytes: 0.04 10*3/uL (ref 0.00–0.07)
Basophils Absolute: 0 10*3/uL (ref 0.0–0.1)
Basophils Relative: 0 %
Eosinophils Absolute: 0 10*3/uL (ref 0.0–0.5)
Eosinophils Relative: 0 %
HCT: 41.3 % (ref 39.0–52.0)
Hemoglobin: 15.1 g/dL (ref 13.0–17.0)
Immature Granulocytes: 0 %
Lymphocytes Relative: 10 %
Lymphs Abs: 1.2 10*3/uL (ref 0.7–4.0)
MCH: 36.4 pg — ABNORMAL HIGH (ref 26.0–34.0)
MCHC: 36.6 g/dL — ABNORMAL HIGH (ref 30.0–36.0)
MCV: 99.5 fL (ref 80.0–100.0)
Monocytes Absolute: 1.5 10*3/uL — ABNORMAL HIGH (ref 0.1–1.0)
Monocytes Relative: 11 %
Neutro Abs: 10.1 10*3/uL — ABNORMAL HIGH (ref 1.7–7.7)
Neutrophils Relative %: 79 %
Platelets: 246 10*3/uL (ref 150–400)
RBC: 4.15 MIL/uL — ABNORMAL LOW (ref 4.22–5.81)
RDW: 11.2 % — ABNORMAL LOW (ref 11.5–15.5)
WBC: 12.9 10*3/uL — ABNORMAL HIGH (ref 4.0–10.5)
nRBC: 0 % (ref 0.0–0.2)

## 2021-12-08 LAB — SODIUM, URINE, RANDOM: Sodium, Ur: 112 mmol/L

## 2021-12-08 LAB — VITAMIN B12: Vitamin B-12: 825 pg/mL (ref 180–914)

## 2021-12-08 LAB — FOLATE: Folate: 21.1 ng/mL (ref >5.9)

## 2021-12-08 LAB — COMPREHENSIVE METABOLIC PANEL
ALT: 22 U/L (ref 0–44)
AST: 30 U/L (ref 15–41)
Albumin: 3.9 g/dL (ref 3.5–5.0)
Alkaline Phosphatase: 70 U/L (ref 38–126)
Anion gap: 11 (ref 5–15)
BUN: 12 mg/dL (ref 8–23)
CO2: 27 mmol/L (ref 22–32)
Calcium: 9.2 mg/dL (ref 8.9–10.3)
Chloride: 92 mmol/L — ABNORMAL LOW (ref 98–111)
Creatinine, Ser: 0.84 mg/dL (ref 0.61–1.24)
GFR, Estimated: >60 mL/min (ref >60)
Glucose, Bld: 95 mg/dL (ref 70–99)
Potassium: 3.8 mmol/L (ref 3.5–5.1)
Sodium: 130 mmol/L — ABNORMAL LOW (ref 135–145)
Total Bilirubin: 0.7 mg/dL (ref 0.3–1.2)
Total Protein: 7.4 g/dL (ref 6.5–8.1)

## 2021-12-08 LAB — EXPECTORATED SPUTUM ASSESSMENT W GRAM STAIN, RFLX TO RESP C

## 2021-12-08 LAB — STREP PNEUMONIAE URINARY ANTIGEN: Strep Pneumo Urinary Antigen: NEGATIVE

## 2021-12-08 LAB — OSMOLALITY, URINE: Osmolality, Ur: 401 mOsm/kg (ref 300–900)

## 2021-12-08 LAB — MAGNESIUM: Magnesium: 2.2 mg/dL (ref 1.7–2.4)

## 2021-12-08 LAB — PHOSPHORUS: Phosphorus: 3.2 mg/dL (ref 2.5–4.6)

## 2021-12-08 MED ORDER — ASPIRIN 81 MG PO CHEW
81.0000 mg | CHEWABLE_TABLET | Freq: Every day | ORAL | Status: DC
  Administered 2021-12-09 – 2021-12-12 (×4): 81 mg via ORAL
  Filled 2021-12-08 (×5): qty 1

## 2021-12-08 MED ORDER — ONDANSETRON HCL 4 MG/2ML IJ SOLN: 4.0000 mg | Freq: Four times a day (QID) | INTRAMUSCULAR | Status: DC | PRN

## 2021-12-08 MED ORDER — SODIUM CHLORIDE 0.9 % IV SOLN
2.0000 g | INTRAVENOUS | Status: DC
  Administered 2021-12-08 – 2021-12-11 (×4): 2 g via INTRAVENOUS
  Filled 2021-12-08 (×4): qty 20

## 2021-12-08 MED ORDER — ALBUTEROL SULFATE (2.5 MG/3ML) 0.083% IN NEBU: 2.5000 mg | INHALATION_SOLUTION | RESPIRATORY_TRACT | Status: DC | PRN

## 2021-12-08 MED ORDER — ONDANSETRON HCL 4 MG PO TABS: 4.0000 mg | ORAL_TABLET | Freq: Four times a day (QID) | ORAL | Status: DC | PRN

## 2021-12-08 MED ORDER — ATORVASTATIN CALCIUM 10 MG PO TABS
10.0000 mg | ORAL_TABLET | Freq: Every day | ORAL | Status: DC
  Administered 2021-12-09 – 2021-12-12 (×4): 10 mg via ORAL
  Filled 2021-12-08 (×5): qty 1

## 2021-12-08 MED ORDER — NICOTINE 21 MG/24HR TD PT24
21.0000 mg | MEDICATED_PATCH | Freq: Every day | TRANSDERMAL | Status: DC | PRN
  Filled 2021-12-08: qty 1

## 2021-12-08 MED ORDER — SODIUM CHLORIDE 0.9 % IV SOLN: INTRAVENOUS | Status: DC

## 2021-12-08 MED ORDER — IRBESARTAN 75 MG PO TABS
75.0000 mg | ORAL_TABLET | Freq: Every day | ORAL | Status: DC
  Administered 2021-12-08 – 2021-12-12 (×5): 75 mg via ORAL
  Filled 2021-12-08 (×6): qty 1

## 2021-12-08 MED ORDER — ACETAMINOPHEN 325 MG PO TABS: 650.0000 mg | ORAL_TABLET | Freq: Four times a day (QID) | ORAL | Status: DC | PRN

## 2021-12-08 MED ORDER — ORAL CARE MOUTH RINSE: 15.0000 mL | OROMUCOSAL | Status: DC | PRN

## 2021-12-08 MED ORDER — ACETAMINOPHEN 650 MG RE SUPP: 650.0000 mg | Freq: Four times a day (QID) | RECTAL | Status: DC | PRN

## 2021-12-08 MED ORDER — SODIUM CHLORIDE 0.9 % IV SOLN
500.0000 mg | INTRAVENOUS | Status: DC
  Administered 2021-12-08 – 2021-12-11 (×4): 500 mg via INTRAVENOUS
  Filled 2021-12-08 (×4): qty 5

## 2021-12-08 MED ORDER — PREDNISONE 20 MG PO TABS
40.0000 mg | ORAL_TABLET | Freq: Every day | ORAL | Status: DC
  Administered 2021-12-09 – 2021-12-10 (×2): 40 mg via ORAL
  Filled 2021-12-08 (×2): qty 2

## 2021-12-08 MED ORDER — METHYLPREDNISOLONE SODIUM SUCC 40 MG IJ SOLR
40.0000 mg | Freq: Once | INTRAMUSCULAR | Status: AC
  Administered 2021-12-08: 40 mg via INTRAVENOUS
  Filled 2021-12-08: qty 1

## 2021-12-08 MED ORDER — AMLODIPINE BESYLATE 5 MG PO TABS
5.0000 mg | ORAL_TABLET | Freq: Every day | ORAL | Status: DC
Start: 2021-12-08 — End: 2021-12-12
  Administered 2021-12-08 – 2021-12-11 (×4): 5 mg via ORAL
  Filled 2021-12-08 (×4): qty 1

## 2021-12-08 MED ORDER — ENOXAPARIN SODIUM 40 MG/0.4ML IJ SOSY
40.0000 mg | PREFILLED_SYRINGE | INTRAMUSCULAR | Status: DC
  Administered 2021-12-08 – 2021-12-11 (×4): 40 mg via SUBCUTANEOUS
  Filled 2021-12-08 (×4): qty 0.4

## 2021-12-08 MED ORDER — CLOPIDOGREL BISULFATE 75 MG PO TABS
75.0000 mg | ORAL_TABLET | Freq: Every day | ORAL | Status: DC
  Administered 2021-12-09 – 2021-12-12 (×4): 75 mg via ORAL
  Filled 2021-12-08 (×5): qty 1

## 2021-12-08 MED ORDER — DOXAZOSIN MESYLATE 8 MG PO TABS
8.0000 mg | ORAL_TABLET | Freq: Every day | ORAL | Status: DC
  Administered 2021-12-09 – 2021-12-11 (×3): 8 mg via ORAL
  Filled 2021-12-08 (×4): qty 1

## 2021-12-08 NOTE — H&P (Signed)
History and Physical    Patient: Jason Carson IRJ:[id number] DOB: 15-Jul-1950 DOA: 12/07/2021 DOS: the patient was seen and examined on 12/08/2021 PCP: Christen Butter, NP  Patient coming from: Home  Chief Complaint:  Chief Complaint  Patient presents with   Shortness of Breath   HPI: Jason Carson is a 71 y.o. male with medical history significant of RBBB, atherosclerosis, CAD, benign essential tremor, lower extremity edema, hyperlipidemia, COPD, tobacco abuse who presented to the emergency department yesterday afternoon due to intermittent fevers, dry cough, headache and dyspnea for 6 days associated with hypoxia in the high 80s at home that decreased to the mid 80s when he exerted.  He has been wheezing, coughing with no significant sputum production and having frequent fevers until earlier this week.  Positive fatigue and malaise.  His appetite is decreased.  No significant trouble sleeping. He denied rhinorrhea, sore throat or hemoptysis.  No chest pain, palpitations, diaphoresis, PND, orthopnea or pitting edema of the lower extremities.  No abdominal pain, nausea, emesis, diarrhea, constipation, melena or hematochezia.  No flank pain, dysuria, frequency or hematuria.  No polyuria, polydipsia, polyphagia or blurred vision.   ED course: Initial vital signs were temperature 98.6 F, pulse 88, respiration 20, BP 151/69 mmHg O2 sat 91% on room air.  The patient received supplemental oxygen, bronchodilators, methylprednisolone, ceftriaxone and azithromycin in the emergency department.  Lab work: CBC 0 white count of 8.7, hemoglobin 14.2 g/dL and platelets 301.  CMP showed a sodium 125 and chloride of 92 mmol/L.  All other electrolytes are normal when calcium is corrected.  Glucose 126 mg/dL, renal function and hepatic functions were normal.  Troponin and BNP unremarkable.  Imaging: Two-view chest radiograph with mild streaky left basilar opacities, favor atelectasis.  There is linear hazy  opacification in the mid lungs bilaterally likely from overlapping soft tissues.  There was hyperinflation due to emphysema.  Review of Systems: As mentioned in the history of present illness. All other systems reviewed and are negative.  Past Medical History:  Diagnosis Date   Atherosclerosis    Benign essential tremor    CAD in native artery 08/17/2021   Lower extremity edema 12/05/2021   Pure hypercholesterolemia 08/17/2021   RBBB 04/12/2021   Tobacco abuse 04/12/2021   Past Surgical History:  Procedure Laterality Date   ABDOMINAL AORTOGRAM W/LOWER EXTREMITY N/A 01/28/2020   Procedure: ABDOMINAL AORTOGRAM W/LOWER EXTREMITY;  Surgeon: Cephus Shelling, MD;  Location: [facility] INVASIVE CV LAB;  Service: Cardiovascular;  Laterality: N/A;   PERIPHERAL VASCULAR INTERVENTION Bilateral 01/28/2020   Procedure: PERIPHERAL VASCULAR INTERVENTION;  Surgeon: Cephus Shelling, MD;  Location: [facility] INVASIVE CV LAB;  Service: Cardiovascular;  Laterality: Bilateral;  external iliac stents   Social History:  reports that he has been smoking cigarettes. He has a 30.00 pack-year smoking history. He has never used smokeless tobacco. He reports current alcohol use of about 8.0 - 10.0 standard drinks of alcohol per week. He reports that he does not use drugs.  No Known Allergies  Family History  Problem Relation Age of Onset   Hypertension Mother    Hyperlipidemia Mother    Hypertension Father     Prior to Admission medications   Medication Sig Start Date End Date Taking? Authorizing Provider  AMBULATORY NON FORMULARY MEDICATION Continuous positive airway pressure (CPAP) machine set on AutoPAP (4-20 cmH2O), with all supplemental supplies as needed. 10/26/20   Christen Butter, NP  amLODipine (NORVASC) 5 MG tablet Take 1 tablet (5 mg total) by  mouth in the morning and at bedtime. 06/20/21   Chilton Si, MD  Aspirin 81 MG CAPS Take 81 mg by mouth daily.    [provider]  atorvastatin (LIPITOR) 10 MG  tablet TAKE 1 TABLET BY MOUTH EVERY DAY 09/26/21   Maeola Harman, MD  clopidogrel (PLAVIX) 75 MG tablet TAKE 1 TABLET BY MOUTH EVERY DAY 09/26/21   Maeola Harman, MD  doxazosin (CARDURA) 8 MG tablet Take 1 tablet (8 mg total) by mouth at bedtime. NEEDS APPOINTMENT FOR FURTHER REFILLS. 12/05/21   Christen Butter, NP  hydrochlorothiazide (HYDRODIURIL) 12.5 MG tablet Take 1 tablet (12.5 mg total) by mouth daily. 10/07/21   Chilton Si, MD  Multiple Vitamin (MULTIVITAMIN WITH MINERALS) TABS tablet Take 1 tablet by mouth daily.    [provider]  valsartan (DIOVAN) 80 MG tablet Take 80 mg by mouth at bedtime.    [provider]    Physical Exam: Vitals:   12/08/21 1200 12/08/21 1241 12/08/21 1323 12/08/21 1325  BP: 135/89  (!) 164/75   Pulse: 77  79   Resp: 17  18   Temp:  98.1 F (36.7 C) 98.6 F (37 C)   TempSrc:  Oral Oral   SpO2: 96%  91% 93%  Weight:   83.1 kg   Height:   6' (1.829 m)    Physical Exam Vitals and nursing note reviewed.  Constitutional:      General: He is awake.     Appearance: He is well-developed and normal weight.     Interventions: Nasal cannula in place.  HENT:     Head: Normocephalic and atraumatic.     Mouth/Throat:     Mouth: Mucous membranes are moist.  Eyes:     General: No scleral icterus.    Pupils: Pupils are equal, round, and reactive to light.  Neck:     Vascular: No JVD.  Cardiovascular:     Rate and Rhythm: Normal rate and regular rhythm.     Heart sounds: S1 normal and S2 normal.  Pulmonary:     Effort: Pulmonary effort is normal.     Breath sounds: Examination of the right-lower field reveals rales. Examination of the left-lower field reveals rales. Decreased breath sounds, wheezing, rhonchi and rales present.  Abdominal:     General: Bowel sounds are normal. There is no distension.     Palpations: Abdomen is soft.     Tenderness: There is no abdominal tenderness. There is no right CVA tenderness  or left CVA tenderness.  Musculoskeletal:     Cervical back: Neck supple.     Right lower leg: No edema.     Left lower leg: No edema.  Skin:    General: Skin is warm and dry.  Neurological:     General: No focal deficit present.     Mental Status: He is alert and oriented to person, place, and time.  Psychiatric:        Mood and Affect: Mood normal.        Behavior: Behavior normal. Behavior is cooperative.   Data Reviewed:  Results are pending, will review when available.  Assessment and Plan: Principal Problem:   CAP (community acquired pneumonia) Admit to telemetry/inpatient. Continue supplemental oxygen. Scheduled and as needed bronchodilators. Continue ceftriaxone 1 g IVPB daily. Continue azithromycin 500 mg IVPB daily. Check strep pneumoniae urinary antigen. Check Legionella urinary antigen. Check sputum Gram stain, culture and sensitivity. Follow-up blood culture and sensitivity. Follow-up CBC and chemistry  in the morning.  Active Problems:   Essential hypertension Continue amlodipine 5 mg p.o. bedtime. Continue valsartan 80 mg p.o. at bedtime or formulary equivalent Monitor blood pressure, heart rate, electrolytes and renal function.    OSA on CPAP Continue CPAP at bedtime.    Tobacco abuse Smoking cessation advised. Nicotine replacement therapy order as needed.    CAD in native artery Continue statin and DAPT.    Pure hypercholesterolemia Continue atorvastatin 10 mg p.o. daily.    Hyponatremia Improving. Check Legionella urinary antigen. Check urine sodium and osmolality. Follow sodium level. Hold hydrochlorothiazide.    Macrocytosis Check B12 level. Check folate level.    Advance Care Planning:   Code Status: Full Code   Consults:   Family Communication:   Severity of Illness: The appropriate patient status for this patient is INPATIENT. Inpatient status is judged to be reasonable and necessary in order to provide the required intensity  of service to ensure the patient's safety. The patient's presenting symptoms, physical exam findings, and initial radiographic and laboratory data in the context of their chronic comorbidities is felt to place them at high risk for further clinical deterioration. Furthermore, it is not anticipated that the patient will be medically stable for discharge from the hospital within 2 midnights of admission.   * I certify that at the point of admission it is my clinical judgment that the patient will require inpatient hospital care spanning beyond 2 midnights from the point of admission due to high intensity of service, high risk for further deterioration and high frequency of surveillance required.*  Author: Reubin Milan, MD 12/08/2021 2:03 PM  For on call review [email or web address].   This document was prepared using Dragon voice recognition software and may contain some unintended transcription errors.

## 2021-12-08 NOTE — ED Notes (Signed)
[facility] enroute to transfer to [facility] Report given to [facility] floor nurse Pt aware of pending transfer and consents

## 2021-12-08 NOTE — Progress Notes (Signed)
Patient dropped to 87% on 2 liter nasal cannula.  RT added humidity bottle to patient's cannula.  Patient's SPO2 is now 98 %.  RT will continue to monitor.

## 2021-12-08 NOTE — Progress Notes (Signed)
Pt arrived to unit. [facility] admitting paged via [facility].

## 2021-12-08 NOTE — ED Notes (Signed)
ED TO INPATIENT HANDOFF REPORT  ED Nurse Name and Phone #:  Zella Ball Cartel Mauss Rn  S Name/Age/Gender Jason Carson 71 y.o. male Room/Bed: MH06/MH06  Code Status   Code Status: Prior  Home/SNF/Other Home Patient oriented to: self, place, time, and situation Is this baseline? Yes   Triage Complete: Triage complete  Chief Complaint CAP (community acquired pneumonia) [J18.9]  Triage Note Pt c/o intermittent fevers, dry cough,headache, and shob since Thursday. Using pulse ox at home with o2 high 80s at rest, and decreases to mid-80s with exertion. RT in triage to assess.    Allergies No Known Allergies  Level of Care/Admitting Diagnosis ED Disposition     ED Disposition  Admit   Condition  --   Comment  Hospital Area: [facility] [[id number]]  Level of Care: Telemetry [5]  Admit to tele based on following criteria: Monitor for Ischemic changes  May admit patient to [facility] or [facility] if equivalent level of care is available:: Yes  Interfacility transfer: Yes  Covid Evaluation: Asymptomatic - no recent exposure (last 10 days) testing not required  Diagnosis: CAP (community acquired pneumonia) [532992]  Admitting Physician: Darlin Drop [[id number]]  Attending Physician: Darlin Drop [[id number]]  Certification:: I certify this patient will need inpatient services for at least 2 midnights  Estimated Length of Stay: 2          B Medical/Surgery History Past Medical History:  Diagnosis Date   Atherosclerosis    Benign essential tremor    CAD in native artery 08/17/2021   Lower extremity edema 12/05/2021   Pure hypercholesterolemia 08/17/2021   RBBB 04/12/2021   Tobacco abuse 04/12/2021   Past Surgical History:  Procedure Laterality Date   ABDOMINAL AORTOGRAM W/LOWER EXTREMITY N/A 01/28/2020   Procedure: ABDOMINAL AORTOGRAM W/LOWER EXTREMITY;  Surgeon: Cephus Shelling, MD;  Location: [facility] INVASIVE CV LAB;  Service: Cardiovascular;  Laterality:  N/A;   PERIPHERAL VASCULAR INTERVENTION Bilateral 01/28/2020   Procedure: PERIPHERAL VASCULAR INTERVENTION;  Surgeon: Cephus Shelling, MD;  Location: [facility] INVASIVE CV LAB;  Service: Cardiovascular;  Laterality: Bilateral;  external iliac stents     A IV Location/Drains/Wounds Patient Lines/Drains/Airways Status     Active Line/Drains/Airways     Name Placement date Placement time Site Days   Peripheral IV 12/07/21 20 G 1" Anterior;Left;Upper Arm 12/07/21  1618  Arm  1            Intake/Output Last 24 hours  Intake/Output Summary (Last 24 hours) at 12/08/2021 1155 Last data filed at 12/07/2021 2259 Gross per 24 hour  Intake 323.09 ml  Output --  Net 323.09 ml    Labs/Imaging Results for orders placed or performed during the hospital encounter of 12/07/21 (from the past 48 hour(s))  SARS Coronavirus 2 by RT PCR (hospital order, performed in [facility] hospital lab) *cepheid single result test* Anterior Nasal Swab     Status: None   Collection Time: 12/07/21  4:16 PM   Specimen: Anterior Nasal Swab  Result Value Ref Range   SARS Coronavirus 2 by RT PCR NEGATIVE NEGATIVE    Comment: (NOTE) SARS-CoV-2 target nucleic acids are NOT DETECTED.  The SARS-CoV-2 RNA is generally detectable in upper and lower respiratory specimens during the acute phase of infection. The lowest concentration of SARS-CoV-2 viral copies this assay can detect is 250 copies / mL. A negative result does not preclude SARS-CoV-2 infection and should not be used as the sole basis for treatment or other  patient management decisions.  A negative result may occur with improper specimen collection / handling, submission of specimen other than nasopharyngeal swab, presence of viral mutation(s) within the areas targeted by this assay, and inadequate number of viral copies (<250 copies / mL). A negative result must be combined with clinical observations, patient history, and epidemiological information.  Fact  Sheet for Patients:   RoadLapTop.co.za  Fact Sheet for Healthcare Providers: http://kim-miller.com/  This test is not yet approved or  cleared by the [place] FDA and has been authorized for detection and/or diagnosis of SARS-CoV-2 by FDA under an Emergency Use Authorization (EUA).  This EUA will remain in effect (meaning this test can be used) for the duration of the COVID-19 declaration under Section 564(b)(1) of the Act, 21 U.S.C. section 360bbb-3(b)(1), unless the authorization is terminated or revoked sooner.  Performed at [facility], [address] [place], [place] [address]   CBC with Differential     Status: Abnormal   Collection Time: 12/07/21  4:16 PM  Result Value Ref Range   WBC 8.7 4.0 - 10.5 K/uL   RBC 3.95 (L) 4.22 - 5.81 MIL/uL   Hemoglobin 14.2 13.0 - 17.0 g/dL   HCT 60.4 (L) 54.0 - 98.1 %   MCV 95.9 80.0 - 100.0 fL   MCH 35.9 (H) 26.0 - 34.0 pg   MCHC 37.5 (H) 30.0 - 36.0 g/dL   RDW 19.1 (L) 47.8 - 29.5 %   Platelets 221 150 - 400 K/uL   nRBC 0.0 0.0 - 0.2 %   Neutrophils Relative % 76 %   Neutro Abs 6.6 1.7 - 7.7 K/uL   Lymphocytes Relative 12 %   Lymphs Abs 1.1 0.7 - 4.0 K/uL   Monocytes Relative 11 %   Monocytes Absolute 1.0 0.1 - 1.0 K/uL   Eosinophils Relative 1 %   Eosinophils Absolute 0.0 0.0 - 0.5 K/uL   Basophils Relative 0 %   Basophils Absolute 0.0 0.0 - 0.1 K/uL   Immature Granulocytes 0 %   Abs Immature Granulocytes 0.03 0.00 - 0.07 K/uL    Comment: Performed at [facility], [address] [place], [place] [address]  Comprehensive metabolic panel     Status: Abnormal   Collection Time: 12/07/21  4:16 PM  Result Value Ref Range   Sodium 125 (L) 135 - 145 mmol/L   Potassium 3.7 3.5 - 5.1 mmol/L   Chloride 92 (L) 98 - 111 mmol/L   CO2 24 22 - 32 mmol/L   Glucose, Bld 126 (H) 70 - 99 mg/dL    Comment: Glucose reference range applies only to samples taken after  fasting for at least 8 hours.   BUN 14 8 - 23 mg/dL   Creatinine, Ser 8.65 0.61 - 1.24 mg/dL   Calcium 8.7 (L) 8.9 - 10.3 mg/dL   Total Protein 6.6 6.5 - 8.1 g/dL   Albumin 3.5 3.5 - 5.0 g/dL   AST 25 15 - 41 U/L   ALT 21 0 - 44 U/L   Alkaline Phosphatase 73 38 - 126 U/L   Total Bilirubin 0.7 0.3 - 1.2 mg/dL   GFR, Estimated >78 >46 mL/min    Comment: (NOTE) Calculated using the CKD-EPI Creatinine Equation (2021)    Anion gap 9 5 - 15    Comment: Performed at [facility], [address] [place], [place] [address]  Troponin I (High Sensitivity)     Status: None  Collection Time: 12/07/21  4:16 PM  Result Value Ref Range   Troponin I (High Sensitivity) 9 <18 ng/L    Comment: (NOTE) Elevated high sensitivity troponin I (hsTnI) values and significant  changes across serial measurements may suggest ACS but many other  chronic and acute conditions are known to elevate hsTnI results.  Refer to the "Links" section for chest pain algorithms and additional  guidance. Performed at [facility], [address], [place], [place] [address]   Brain natriuretic peptide     Status: None   Collection Time: 12/07/21  4:16 PM  Result Value Ref Range   B Natriuretic Peptide 50.2 0.0 - 100.0 pg/mL    Comment: Performed at [facility], [address] [place], [place] [address]  Troponin I (High Sensitivity)     Status: None   Collection Time: 12/07/21  6:37 PM  Result Value Ref Range   Troponin I (High Sensitivity) 8 <18 ng/L    Comment: (NOTE) Elevated high sensitivity troponin I (hsTnI) values and significant  changes across serial measurements may suggest ACS but many other  chronic and acute conditions are known to elevate hsTnI results.  Refer to the "Links" section for chest pain algorithms and additional  guidance. Performed at [facility], [address] [place], [place] [address]    CT Angio Chest PE W/Cm &/Or Wo Cm  Result Date:  12/07/2021 CLINICAL DATA:  Pulmonary embolism (PE) suspected, high prob. Fever, cough, shortness of breath EXAM: CT ANGIOGRAPHY CHEST WITH CONTRAST TECHNIQUE: Multidetector CT imaging of the chest was performed using the standard protocol during bolus administration of intravenous contrast. Multiplanar CT image reconstructions and MIPs were obtained to evaluate the vascular anatomy. RADIATION DOSE REDUCTION: This exam was performed according to the departmental dose-optimization program which includes automated exposure control, adjustment of the mA and/or kV according to patient size and/or use of iterative reconstruction technique. CONTRAST:  66mL OMNIPAQUE IOHEXOL 350 MG/ML SOLN COMPARISON:  01/19/2021 FINDINGS: Cardiovascular: No filling defects in the pulmonary arteries to suggest pulmonary emboli. Heart is normal size. Aorta is normal caliber. Coronary artery and aortic calcifications. Mediastinum/Nodes: No mediastinal, hilar, or axillary adenopathy. Trachea and esophagus are unremarkable. Thyroid unremarkable. Lungs/Pleura: Nodular opacity in the superior segment of the right lower lobe 2 cm. This could be infectious/inflammatory as there are other ground-glass opacities also in the right lower lobe. Left lung clear. No effusions. Upper Abdomen: No acute findings Musculoskeletal: Chest wall soft tissues are unremarkable. No acute bony abnormality. Review of the MIP images confirms the above findings. IMPRESSION: No evidence of pulmonary embolus. Coronary artery disease. Patchy ground-glass and nodular opacities in the right lower lobe. Favor pneumonia. Followup chest CT is recommended in 3-4 weeks following trial of antibiotic therapy to ensure resolution and exclude underlying malignancy. Aortic Atherosclerosis (ICD10-I70.0). Electronically Signed   By: Charlett Nose M.D.   On: 12/07/2021 19:29   DG Chest 2 View  Result Date: 12/07/2021 CLINICAL DATA:  Shortness of breath. EXAM: CHEST - 2 VIEW COMPARISON:   CT chest 01/19/2021. FINDINGS: Mild streaky left basilar opacities, favor atelectasis. Linear hazy opacification in the mid lungs bilaterally. Hyperinflation. No visible pleural effusions or pneumothorax. Biapical pleuroparenchymal scarring. Cardiomediastinal silhouette is within normal limits. No acute osseous abnormality. IMPRESSION: 1. Mild streaky left basilar opacities, favor atelectasis. 2. Linear hazy opacification in the mid lungs bilaterally is favored to represent attenuation from overlapping soft tissues. 3. Hyperinflation, compatible with emphysema. Electronically  Signed   By: Feliberto Harts M.D.   On: 12/07/2021 16:50    Pending Labs Unresulted Labs (From admission, onward)    None       Vitals/Pain Today's Vitals   12/08/21 0821 12/08/21 0826 12/08/21 1000 12/08/21 1100  BP:   132/75 (!) 142/73  Pulse:   79 89  Resp:   18 15  Temp: 97.9 F (36.6 C)     TempSrc: Oral     SpO2:  93% 93% 96%  Weight:      Height:      PainSc:        Isolation Precautions Airborne and Contact precautions  Medications Medications  ipratropium-albuterol (DUONEB) 0.5-2.5 (3) MG/3ML nebulizer solution 3 mL (3 mLs Nebulization Given 12/08/21 0826)  ipratropium-albuterol (DUONEB) 0.5-2.5 (3) MG/3ML nebulizer solution (3 mLs  Given 12/07/21 1614)  albuterol (VENTOLIN) (5 MG/ML) 0.5% continuous inhalation solution (2.5 mg  Given 12/07/21 1614)  ipratropium-albuterol (DUONEB) 0.5-2.5 (3) MG/3ML nebulizer solution 3 mL (3 mLs Nebulization Given 12/07/21 1745)  methylPREDNISolone sodium succinate (SOLU-MEDROL) 125 mg/2 mL injection 125 mg (125 mg Intravenous Given 12/07/21 1745)  cefTRIAXone (ROCEPHIN) 1 g in sodium chloride 0.9 % 100 mL IVPB (0 g Intravenous Stopped 12/07/21 1906)  azithromycin (ZITHROMAX) 500 mg in sodium chloride 0.9 % 250 mL IVPB (0 mg Intravenous Stopped 12/07/21 2259)  iohexol (OMNIPAQUE) 350 MG/ML injection 75 mL (75 mLs Intravenous Contrast Given 12/07/21 1906)     Mobility walks Low fall risk   Focused Assessments Rhonchi throughout lungs. SOB worse with exertion. No home 02, just cpap at HS. 02 4 Lvia Badger  R Recommendations: See Admitting Provider Note  Report given to:   Additional Notes:  negative covid.

## 2021-12-08 NOTE — Progress Notes (Signed)
Pt refused CPAP qhs. Pt states that he would rather sleep with his nasal cannula on tonight instead of his CPAP.  Pt encouraged to cotnact RT should he change his mind.

## 2021-12-08 NOTE — Plan of Care (Signed)

## 2021-12-09 ENCOUNTER — Telehealth (HOSPITAL_BASED_OUTPATIENT_CLINIC_OR_DEPARTMENT_OTHER): Payer: Medicare Other | Admitting: Cardiovascular Disease

## 2021-12-09 DIAGNOSIS — I2510 Atherosclerotic heart disease of native coronary artery without angina pectoris: Secondary | ICD-10-CM | POA: Diagnosis not present

## 2021-12-09 DIAGNOSIS — E871 Hypo-osmolality and hyponatremia: Secondary | ICD-10-CM | POA: Diagnosis not present

## 2021-12-09 DIAGNOSIS — I10 Essential (primary) hypertension: Secondary | ICD-10-CM | POA: Diagnosis not present

## 2021-12-09 DIAGNOSIS — J189 Pneumonia, unspecified organism: Secondary | ICD-10-CM | POA: Diagnosis not present

## 2021-12-09 DIAGNOSIS — D72829 Elevated white blood cell count, unspecified: Secondary | ICD-10-CM

## 2021-12-09 LAB — BASIC METABOLIC PANEL
Anion gap: 8 (ref 5–15)
BUN: 14 mg/dL (ref 8–23)
CO2: 27 mmol/L (ref 22–32)
Calcium: 8.8 mg/dL — ABNORMAL LOW (ref 8.9–10.3)
Chloride: 98 mmol/L (ref 98–111)
Creatinine, Ser: 0.73 mg/dL (ref 0.61–1.24)
GFR, Estimated: >60 mL/min (ref >60)
Glucose, Bld: 127 mg/dL — ABNORMAL HIGH (ref 70–99)
Potassium: 4.4 mmol/L (ref 3.5–5.1)
Sodium: 133 mmol/L — ABNORMAL LOW (ref 135–145)

## 2021-12-09 LAB — CBC
HCT: 38.7 % — ABNORMAL LOW (ref 39.0–52.0)
Hemoglobin: 13.9 g/dL (ref 13.0–17.0)
MCH: 36 pg — ABNORMAL HIGH (ref 26.0–34.0)
MCHC: 35.9 g/dL (ref 30.0–36.0)
MCV: 100.3 fL — ABNORMAL HIGH (ref 80.0–100.0)
Platelets: 248 10*3/uL (ref 150–400)
RBC: 3.86 MIL/uL — ABNORMAL LOW (ref 4.22–5.81)
RDW: 11.3 % — ABNORMAL LOW (ref 11.5–15.5)
WBC: 9.1 10*3/uL (ref 4.0–10.5)
nRBC: 0 % (ref 0.0–0.2)

## 2021-12-09 MED ORDER — GUAIFENESIN-DM 100-10 MG/5ML PO SYRP
5.0000 mL | ORAL_SOLUTION | ORAL | Status: DC | PRN
  Administered 2021-12-09 – 2021-12-11 (×3): 5 mL via ORAL
  Filled 2021-12-09 (×3): qty 5

## 2021-12-09 MED ORDER — POLYETHYLENE GLYCOL 3350 17 G PO PACK
17.0000 g | PACK | Freq: Every day | ORAL | Status: DC | PRN
  Administered 2021-12-10 – 2021-12-11 (×2): 17 g via ORAL
  Filled 2021-12-09 (×2): qty 1

## 2021-12-09 MED ORDER — ALBUTEROL SULFATE (2.5 MG/3ML) 0.083% IN NEBU: 2.5000 mg | INHALATION_SOLUTION | RESPIRATORY_TRACT | Status: DC | PRN

## 2021-12-09 MED ORDER — IPRATROPIUM-ALBUTEROL 0.5-2.5 (3) MG/3ML IN SOLN
3.0000 mL | Freq: Three times a day (TID) | RESPIRATORY_TRACT | Status: DC
  Administered 2021-12-10: 3 mL via RESPIRATORY_TRACT
  Filled 2021-12-09: qty 3

## 2021-12-09 MED ORDER — IPRATROPIUM-ALBUTEROL 0.5-2.5 (3) MG/3ML IN SOLN
3.0000 mL | Freq: Four times a day (QID) | RESPIRATORY_TRACT | Status: DC
Start: 2021-12-09 — End: 2021-12-09
  Administered 2021-12-09 (×2): 3 mL via RESPIRATORY_TRACT
  Filled 2021-12-09 (×2): qty 3

## 2021-12-09 MED ORDER — SENNOSIDES-DOCUSATE SODIUM 8.6-50 MG PO TABS
1.0000 | ORAL_TABLET | Freq: Two times a day (BID) | ORAL | Status: DC
  Administered 2021-12-09 – 2021-12-11 (×5): 1 via ORAL
  Filled 2021-12-09 (×5): qty 1

## 2021-12-09 MED ORDER — BISACODYL 10 MG RE SUPP
10.0000 mg | Freq: Every day | RECTAL | Status: DC | PRN
  Administered 2021-12-10: 10 mg via RECTAL
  Filled 2021-12-09: qty 1

## 2021-12-09 MED ORDER — BUDESONIDE 0.5 MG/2ML IN SUSP
0.5000 mg | Freq: Two times a day (BID) | RESPIRATORY_TRACT | Status: DC
  Administered 2021-12-09 – 2021-12-12 (×6): 0.5 mg via RESPIRATORY_TRACT
  Filled 2021-12-09 (×7): qty 2

## 2021-12-09 NOTE — Plan of Care (Signed)

## 2021-12-09 NOTE — Progress Notes (Signed)
  Transition of Care [facility]) Screening Note   Patient Details  Name: Jason Carson Date of Birth: July 28, 1950   Transition of Care [facility]) CM/SW Contact:    Otelia Santee, LCSW Phone Number: 12/09/2021, 2:29 PM    Transition of Care Department [facility]) has reviewed patient and no TOC needs have been identified at this time. We will continue to monitor patient advancement through interdisciplinary progression rounds. If new patient transition needs arise, please place a TOC consult.

## 2021-12-09 NOTE — Progress Notes (Signed)
PROGRESS NOTE    Jason Carson  XVQ:[id number] DOB: Aug 03, 1950 DOA: 12/07/2021 PCP: Christen Butter, NP   Brief Narrative:   71 y.o. male with medical history significant of RBBB, atherosclerosis, CAD, benign essential tremor, hyperlipidemia, COPD, tobacco abuse presented with worsening shortness of breath.  On presentation, he was saturating 91% on room air.  Chest x-ray showed mild streaky left basilar opacities with increased normal me hyperinflation due to emphysema.  He was started on IV antibiotics and oral steroids.    Assessment & Plan:   Possible community-acquired bacterial pneumonia Acute respiratory failure with hypoxia -COVID-19 testing negative on presentation. -Continue Rocephin and Zithromax.  Currently on 2 to 3 L oxygen via nasal cannula.  Wean off as able.  Possible COPD with exacerbation Tobacco abuse -Currently on Solu-Medrol 40 mg daily.  Add nebs. -Patient was counseled regarding the medication admitting hospitalist  hypertension -Monitor blood pressure.  Continue amlodipine and irbesartan  Hyponatremia -Possibly from poor oral intake.  Improving presented with sodium of 125 and sodium is 133 today.  Continue IV fluids.  Repeat a.m. labs.  Hyperlipidemia -Continue statin  CAD -Continue statin and aspirin/Plavix.  Outpatient follow-up with cardiology.  No signs of chest pain  Leukocytosis -Resolved   DVT prophylaxis: Lovenox Code Status: Full Family Communication: None at bedside Disposition Plan: Status is: Inpatient Remains inpatient appropriate because: Of severity of illness  Consultants: None  Procedures: None  Antimicrobials: Rocephin and Zithromax from 12/25/2021 onwards   Subjective: Patient seen and examined at bedside.  Feels slightly better but still feels short of breath with exertion with some cough.  No overnight fever, vomiting, chest pain reported.  Objective: Vitals:   12/08/21 2024 12/09/21 0128 12/09/21 0535 12/09/21 1253   BP: 135/80 (!) 141/80 (!) 143/79 (!) 157/81  Pulse: 75 75 80 85  Resp: 19 (!) 21 20 (!) 22  Temp: 98.8 F (37.1 C) 97.9 F (36.6 C) 98.7 F (37.1 C) 97.9 F (36.6 C)  TempSrc:   Oral Oral  SpO2: 94% 96% 94% 92%  Weight:      Height:        Intake/Output Summary (Last 24 hours) at 12/09/2021 1332 Last data filed at 12/09/2021 1000 Gross per 24 hour  Intake 1017.54 ml  Output 2235 ml  Net -1217.46 ml   Filed Weights   12/07/21 1603 12/08/21 1323  Weight: 83.9 kg 83.1 kg    Examination:  General exam: Appears calm and comfortable.  Currently on 2 to 3 L oxygen via nasal cannula. Respiratory system: Bilateral decreased breath sounds at bases with intermittent tachypnea and scattered crackles Cardiovascular system: S1 & S2 heard, Rate controlled Gastrointestinal system: Abdomen is nondistended, soft and nontender. Normal bowel sounds heard. Extremities: No cyanosis, clubbing; trace lower extremity edema Central nervous system: Alert and oriented. No focal neurological deficits. Moving extremities Skin: No rashes, lesions or ulcers Psychiatry: Judgement and insight appear normal. Mood & affect appropriate.     Data Reviewed: I have personally reviewed following labs and imaging studies  CBC: Recent Labs  Lab 12/07/21 1616 12/08/21 1425 12/09/21 0538  WBC 8.7 12.9* 9.1  NEUTROABS 6.6 10.1*  --   HGB 14.2 15.1 13.9  HCT 37.9* 41.3 38.7*  MCV 95.9 99.5 100.3*  PLT 221 246 248   Basic Metabolic Panel: Recent Labs  Lab 12/07/21 1616 12/08/21 1425 12/09/21 0538  NA 125* 130* 133*  K 3.7 3.8 4.4  CL 92* 92* 98  CO2 24 27 27   GLUCOSE 126*  95 127*  BUN 14 12 14   CREATININE 0.71 0.84 0.73  CALCIUM 8.7* 9.2 8.8*  MG  --  2.2  --   PHOS  --  3.2  --    GFR: Estimated Creatinine Clearance: 93 mL/min (by C-G formula based on SCr of 0.73 mg/dL). Liver Function Tests: Recent Labs  Lab 12/07/21 1616 12/08/21 1425  AST 25 30  ALT 21 22  ALKPHOS 73 70  BILITOT  0.7 0.7  PROT 6.6 7.4  ALBUMIN 3.5 3.9   No results for input(s): "LIPASE", "AMYLASE" in the last 168 hours. No results for input(s): "AMMONIA" in the last 168 hours. Coagulation Profile: No results for input(s): "INR", "PROTIME" in the last 168 hours. Cardiac Enzymes: No results for input(s): "CKTOTAL", "CKMB", "CKMBINDEX", "TROPONINI" in the last 168 hours. BNP (last 3 results) No results for input(s): "PROBNP" in the last 8760 hours. HbA1C: No results for input(s): "HGBA1C" in the last 72 hours. CBG: No results for input(s): "GLUCAP" in the last 168 hours. Lipid Profile: No results for input(s): "CHOL", "HDL", "LDLCALC", "TRIG", "CHOLHDL", "LDLDIRECT" in the last 72 hours. Thyroid Function Tests: No results for input(s): "TSH", "T4TOTAL", "FREET4", "T3FREE", "THYROIDAB" in the last 72 hours. Anemia Panel: Recent Labs    12/08/21 1425  VITAMINB12 825  FOLATE 21.1   Sepsis Labs: No results for input(s): "PROCALCITON", "LATICACIDVEN" in the last 168 hours.  Recent Results (from the past 240 hour(s))  SARS Coronavirus 2 by RT PCR (hospital order, performed in [facility] hospital lab) *cepheid single result test* Anterior Nasal Swab     Status: None   Collection Time: 12/07/21  4:16 PM   Specimen: Anterior Nasal Swab  Result Value Ref Range Status   SARS Coronavirus 2 by RT PCR NEGATIVE NEGATIVE Final    Comment: (NOTE) SARS-CoV-2 target nucleic acids are NOT DETECTED.  The SARS-CoV-2 RNA is generally detectable in upper and lower respiratory specimens during the acute phase of infection. The lowest concentration of SARS-CoV-2 viral copies this assay can detect is 250 copies / mL. A negative result does not preclude SARS-CoV-2 infection and should not be used as the sole basis for treatment or other patient management decisions.  A negative result may occur with improper specimen collection / handling, submission of specimen other than nasopharyngeal swab, presence of  viral mutation(s) within the areas targeted by this assay, and inadequate number of viral copies (<250 copies / mL). A negative result must be combined with clinical observations, patient history, and epidemiological information.  Fact Sheet for Patients:   02/07/22  Fact Sheet for Healthcare Providers: RoadLapTop.co.za  This test is not yet approved or  cleared by the http://kim-miller.com/ FDA and has been authorized for detection and/or diagnosis of SARS-CoV-2 by FDA under an Emergency Use Authorization (EUA).  This EUA will remain in effect (meaning this test can be used) for the duration of the COVID-19 declaration under Section 564(b)(1) of the Act, 21 U.S.C. section 360bbb-3(b)(1), unless the authorization is terminated or revoked sooner.  Performed at [facility], [address] [place], [place]   Expectorated Sputum Assessment w Gram Stain, Rflx to Resp Cult     Status: None   Collection Time: 12/08/21  2:59 PM   Specimen: Sputum  Result Value Ref Range Status   Specimen Description SPUTUM  Final   Special Requests NONE  Final   Sputum evaluation   Final    THIS SPECIMEN IS ACCEPTABLE FOR SPUTUM CULTURE  Performed at [facility], [address], [place], [place] [address]    Report Status 12/08/2021 FINAL  Final  Culture, Respiratory w Gram Stain     Status: None (Preliminary result)   Collection Time: 12/08/21  2:59 PM   Specimen: SPU  Result Value Ref Range Status   Specimen Description   Final    SPUTUM Performed at [facility], [address] [place], [place] [address]    Special Requests   Final    NONE Reflexed from [id number] Performed at [facility], [address], [place], [place] [address]    Gram Stain   Final    NO WBC SEEN RARE GRAM POSITIVE RODS RARE GRAM POSITIVE COCCI RARE GRAM NEGATIVE RODS    Culture   Final     TOO YOUNG TO READ Performed at [facility], [address], [place], [place] [address]    Report Status PENDING  Incomplete         Radiology Studies: CT Angio Chest PE W/Cm &/Or Wo Cm  Result Date: 12/07/2021 CLINICAL DATA:  Pulmonary embolism (PE) suspected, high prob. Fever, cough, shortness of breath EXAM: CT ANGIOGRAPHY CHEST WITH CONTRAST TECHNIQUE: Multidetector CT imaging of the chest was performed using the standard protocol during bolus administration of intravenous contrast. Multiplanar CT image reconstructions and MIPs were obtained to evaluate the vascular anatomy. RADIATION DOSE REDUCTION: This exam was performed according to the departmental dose-optimization program which includes automated exposure control, adjustment of the mA and/or kV according to patient size and/or use of iterative reconstruction technique. CONTRAST:  58mL OMNIPAQUE IOHEXOL 350 MG/ML SOLN COMPARISON:  01/19/2021 FINDINGS: Cardiovascular: No filling defects in the pulmonary arteries to suggest pulmonary emboli. Heart is normal size. Aorta is normal caliber. Coronary artery and aortic calcifications. Mediastinum/Nodes: No mediastinal, hilar, or axillary adenopathy. Trachea and esophagus are unremarkable. Thyroid unremarkable. Lungs/Pleura: Nodular opacity in the superior segment of the right lower lobe 2 cm. This could be infectious/inflammatory as there are other ground-glass opacities also in the right lower lobe. Left lung clear. No effusions. Upper Abdomen: No acute findings Musculoskeletal: Chest wall soft tissues are unremarkable. No acute bony abnormality. Review of the MIP images confirms the above findings. IMPRESSION: No evidence of pulmonary embolus. Coronary artery disease. Patchy ground-glass and nodular opacities in the right lower lobe. Favor pneumonia. Followup chest CT is recommended in 3-4 weeks following trial of antibiotic therapy to ensure resolution and exclude underlying malignancy.  Aortic Atherosclerosis (ICD10-I70.0). Electronically Signed   By: Charlett Nose M.D.   On: 12/07/2021 19:29   DG Chest 2 View  Result Date: 12/07/2021 CLINICAL DATA:  Shortness of breath. EXAM: CHEST - 2 VIEW COMPARISON:  CT chest 01/19/2021. FINDINGS: Mild streaky left basilar opacities, favor atelectasis. Linear hazy opacification in the mid lungs bilaterally. Hyperinflation. No visible pleural effusions or pneumothorax. Biapical pleuroparenchymal scarring. Cardiomediastinal silhouette is within normal limits. No acute osseous abnormality. IMPRESSION: 1. Mild streaky left basilar opacities, favor atelectasis. 2. Linear hazy opacification in the mid lungs bilaterally is favored to represent attenuation from overlapping soft tissues. 3. Hyperinflation, compatible with emphysema. Electronically Signed   By: Feliberto Harts M.D.   On: 12/07/2021 16:50        Scheduled Meds:  amLODipine  5 mg Oral QHS   aspirin  81 mg Oral Daily   atorvastatin  10 mg Oral Daily   clopidogrel  75 mg Oral Daily   doxazosin  8 mg  Oral QHS   enoxaparin (LOVENOX) injection  40 mg Subcutaneous Q24H   irbesartan  75 mg Oral Daily   predniSONE  40 mg Oral Q breakfast   senna-docusate  1 tablet Oral BID   Continuous Infusions:  sodium chloride 100 mL/hr at 12/09/21 1219   azithromycin 500 mg (12/08/21 2114)   cefTRIAXone (ROCEPHIN)  IV 2 g (12/08/21 1741)          Glade Lloyd, MD [facility] 12/09/2021, 1:32 PM

## 2021-12-09 NOTE — Progress Notes (Signed)
Patient refuses cpap again tonight I encouraged him to let nurse know if he changes his mind. He stated that he does not wear his cpap at home all the time. I told him that we could put the oxygen through the cpap but he still said no.

## 2021-12-10 ENCOUNTER — Other Ambulatory Visit: Payer: Self-pay | Admitting: Cardiovascular Disease

## 2021-12-10 DIAGNOSIS — I10 Essential (primary) hypertension: Secondary | ICD-10-CM | POA: Diagnosis not present

## 2021-12-10 DIAGNOSIS — I2510 Atherosclerotic heart disease of native coronary artery without angina pectoris: Secondary | ICD-10-CM | POA: Diagnosis not present

## 2021-12-10 DIAGNOSIS — D72829 Elevated white blood cell count, unspecified: Secondary | ICD-10-CM

## 2021-12-10 DIAGNOSIS — J189 Pneumonia, unspecified organism: Secondary | ICD-10-CM | POA: Diagnosis not present

## 2021-12-10 DIAGNOSIS — E871 Hypo-osmolality and hyponatremia: Secondary | ICD-10-CM | POA: Diagnosis not present

## 2021-12-10 LAB — CBC WITH DIFFERENTIAL/PLATELET
Abs Immature Granulocytes: 0.03 10*3/uL (ref 0.00–0.07)
Basophils Absolute: 0 10*3/uL (ref 0.0–0.1)
Basophils Relative: 0 %
Eosinophils Absolute: 0 10*3/uL (ref 0.0–0.5)
Eosinophils Relative: 0 %
HCT: 35.7 % — ABNORMAL LOW (ref 39.0–52.0)
Hemoglobin: 12.8 g/dL — ABNORMAL LOW (ref 13.0–17.0)
Immature Granulocytes: 0 %
Lymphocytes Relative: 19 %
Lymphs Abs: 1.8 10*3/uL (ref 0.7–4.0)
MCH: 36 pg — ABNORMAL HIGH (ref 26.0–34.0)
MCHC: 35.9 g/dL (ref 30.0–36.0)
MCV: 100.3 fL — ABNORMAL HIGH (ref 80.0–100.0)
Monocytes Absolute: 1.1 10*3/uL — ABNORMAL HIGH (ref 0.1–1.0)
Monocytes Relative: 12 %
Neutro Abs: 6.6 10*3/uL (ref 1.7–7.7)
Neutrophils Relative %: 69 %
Platelets: 242 10*3/uL (ref 150–400)
RBC: 3.56 MIL/uL — ABNORMAL LOW (ref 4.22–5.81)
RDW: 11.5 % (ref 11.5–15.5)
WBC: 9.7 10*3/uL (ref 4.0–10.5)
nRBC: 0 % (ref 0.0–0.2)

## 2021-12-10 LAB — COMPREHENSIVE METABOLIC PANEL
ALT: 20 U/L (ref 0–44)
AST: 23 U/L (ref 15–41)
Albumin: 3.1 g/dL — ABNORMAL LOW (ref 3.5–5.0)
Alkaline Phosphatase: 51 U/L (ref 38–126)
Anion gap: 7 (ref 5–15)
BUN: 15 mg/dL (ref 8–23)
CO2: 26 mmol/L (ref 22–32)
Calcium: 8.3 mg/dL — ABNORMAL LOW (ref 8.9–10.3)
Chloride: 103 mmol/L (ref 98–111)
Creatinine, Ser: 0.7 mg/dL (ref 0.61–1.24)
GFR, Estimated: >60 mL/min (ref >60)
Glucose, Bld: 100 mg/dL — ABNORMAL HIGH (ref 70–99)
Potassium: 3.7 mmol/L (ref 3.5–5.1)
Sodium: 136 mmol/L (ref 135–145)
Total Bilirubin: 0.6 mg/dL (ref 0.3–1.2)
Total Protein: 5.6 g/dL — ABNORMAL LOW (ref 6.5–8.1)

## 2021-12-10 LAB — MAGNESIUM: Magnesium: 2.2 mg/dL (ref 1.7–2.4)

## 2021-12-10 MED ORDER — IPRATROPIUM-ALBUTEROL 0.5-2.5 (3) MG/3ML IN SOLN
3.0000 mL | Freq: Two times a day (BID) | RESPIRATORY_TRACT | Status: DC
  Administered 2021-12-10 – 2021-12-12 (×4): 3 mL via RESPIRATORY_TRACT
  Filled 2021-12-10 (×4): qty 3

## 2021-12-10 NOTE — Progress Notes (Signed)
SATURATION QUALIFICATIONS: (This note is used to comply with regulatory documentation for home oxygen)  Patient Saturations on Room Air at Rest = 93%  Patient Saturations on Room Air while Ambulating = 84%  Patient Saturations on 2 Liters of oxygen while Ambulating = 95%  Please briefly explain why patient needs home oxygen: Pt oxygen desaturates when ambulating. Pt requiring 2L of oxygen when ambulating.

## 2021-12-10 NOTE — Progress Notes (Signed)
PROGRESS NOTE    Jason Carson  QQV:[id number] DOB: [birth year] DOA: 12/07/2021 PCP: Christen Butter, NP   Brief Narrative:   71 y.o. male with medical history significant of RBBB, atherosclerosis, CAD, benign essential tremor, hyperlipidemia, COPD, tobacco abuse presented with worsening shortness of breath.  On presentation, he was saturating 91% on room air.  Chest x-ray showed mild streaky left basilar opacities with increased normal me hyperinflation due to emphysema.  He was started on IV antibiotics and oral steroids.    Assessment & Plan:   Possible community-acquired bacterial pneumonia Acute respiratory failure with hypoxia -COVID-19 testing negative on presentation. -Continue Rocephin and Zithromax.  Currently on 2L oxygen via nasal cannula.  Wean off as able.  Possible COPD with exacerbation Tobacco abuse -Currently on prednisone 40 mg daily.  Continue nebs -Patient was counseled regarding tobacco cessation by admitting hospitalist  hypertension -Monitor blood pressure.  Continue amlodipine and irbesartan  Hyponatremia -Possibly from poor oral intake.  Improved, 136 today.  DC IV fluids  Hyperlipidemia -Continue statin  CAD -Continue statin and aspirin/Plavix.  Outpatient follow-up with cardiology.  No signs of chest pain  Leukocytosis -Resolved   DVT prophylaxis: Lovenox Code Status: Full Family Communication: None at bedside Disposition Plan: Status is: Inpatient Remains inpatient appropriate because: Of severity of illness  Consultants: None  Procedures: None  Antimicrobials: Rocephin and Zithromax from 12/25/2021 onwards   Subjective: Patient seen and examined at bedside.  Breathing is improving.  Still short of breath with slight exertion.  No chest pain, fever or vomiting reported. Objective: Vitals:   12/10/21 0134 12/10/21 0321 12/10/21 0423 12/10/21 0736  BP: 132/75 136/72    Pulse: 61 79    Resp:  16    Temp:  97.8 F (36.6 C)    TempSrc:   Oral    SpO2:  93%  94%  Weight:   85.1 kg   Height:        Intake/Output Summary (Last 24 hours) at 12/10/2021 1127 Last data filed at 12/10/2021 0805 Gross per 24 hour  Intake 3469.13 ml  Output 1865 ml  Net 1604.13 ml    Filed Weights   12/07/21 1603 12/08/21 1323 12/10/21 0423  Weight: 83.9 kg 83.1 kg 85.1 kg    Examination:  General: On 2 L oxygen via nasal cannula.  No distress ENT/neck: No thyromegaly.  JVD is not elevated  respiratory: Decreased breath sounds at bases bilaterally with some crackles; no wheezing  CVS: S1-S2 heard, rate controlled currently Abdominal: Soft, nontender, slightly distended; no organomegaly, bowel sounds are heard Extremities: Trace lower extremity edema; no cyanosis  CNS: Awake and alert.  No focal neurologic deficit.  Moves extremities Lymph: No obvious lymphadenopathy Skin: No obvious ecchymosis/lesions  psych: Affect, judgment and mood are normal  musculoskeletal: No obvious joint swelling/deformity     Data Reviewed: I have personally reviewed following labs and imaging studies  CBC: Recent Labs  Lab 12/07/21 1616 12/08/21 1425 12/09/21 0538 12/10/21 0532  WBC 8.7 12.9* 9.1 9.7  NEUTROABS 6.6 10.1*  --  6.6  HGB 14.2 15.1 13.9 12.8*  HCT 37.9* 41.3 38.7* 35.7*  MCV 95.9 99.5 100.3* 100.3*  PLT 221 246 248 242    Basic Metabolic Panel: Recent Labs  Lab 12/07/21 1616 12/08/21 1425 12/09/21 0538 12/10/21 0532  NA 125* 130* 133* 136  K 3.7 3.8 4.4 3.7  CL 92* 92* 98 103  CO2 24 27 27 26   GLUCOSE 126* 95 127* 100*  BUN 14  12 14 15   CREATININE 0.71 0.84 0.73 0.70  CALCIUM 8.7* 9.2 8.8* 8.3*  MG  --  2.2  --  2.2  PHOS  --  3.2  --   --     GFR: Estimated Creatinine Clearance: 93 mL/min (by C-G formula based on SCr of 0.7 mg/dL). Liver Function Tests: Recent Labs  Lab 12/07/21 1616 12/08/21 1425 12/10/21 0532  AST 25 30 23   ALT 21 22 20   ALKPHOS 73 70 51  BILITOT 0.7 0.7 0.6  PROT 6.6 7.4 5.6*   ALBUMIN 3.5 3.9 3.1*    No results for input(s): "LIPASE", "AMYLASE" in the last 168 hours. No results for input(s): "AMMONIA" in the last 168 hours. Coagulation Profile: No results for input(s): "INR", "PROTIME" in the last 168 hours. Cardiac Enzymes: No results for input(s): "CKTOTAL", "CKMB", "CKMBINDEX", "TROPONINI" in the last 168 hours. BNP (last 3 results) No results for input(s): "PROBNP" in the last 8760 hours. HbA1C: No results for input(s): "HGBA1C" in the last 72 hours. CBG: No results for input(s): "GLUCAP" in the last 168 hours. Lipid Profile: No results for input(s): "CHOL", "HDL", "LDLCALC", "TRIG", "CHOLHDL", "LDLDIRECT" in the last 72 hours. Thyroid Function Tests: No results for input(s): "TSH", "T4TOTAL", "FREET4", "T3FREE", "THYROIDAB" in the last 72 hours. Anemia Panel: Recent Labs    12/08/21 1425  VITAMINB12 825  FOLATE 21.1    Sepsis Labs: No results for input(s): "PROCALCITON", "LATICACIDVEN" in the last 168 hours.  Recent Results (from the past 240 hour(s))  SARS Coronavirus 2 by RT PCR (hospital order, performed in [facility] hospital lab) *cepheid single result test* Anterior Nasal Swab     Status: None   Collection Time: 12/07/21  4:16 PM   Specimen: Anterior Nasal Swab  Result Value Ref Range Status   SARS Coronavirus 2 by RT PCR NEGATIVE NEGATIVE Final    Comment: (NOTE) SARS-CoV-2 target nucleic acids are NOT DETECTED.  The SARS-CoV-2 RNA is generally detectable in upper and lower respiratory specimens during the acute phase of infection. The lowest concentration of SARS-CoV-2 viral copies this assay can detect is 250 copies / mL. A negative result does not preclude SARS-CoV-2 infection and should not be used as the sole basis for treatment or other patient management decisions.  A negative result may occur with improper specimen collection / handling, submission of specimen other than nasopharyngeal swab, presence of viral mutation(s)  within the areas targeted by this assay, and inadequate number of viral copies (<250 copies / mL). A negative result must be combined with clinical observations, patient history, and epidemiological information.  Fact Sheet for Patients:   12/10/21  Fact Sheet for Healthcare Providers: [facility]  This test is not yet approved or  cleared by the 02/07/22 FDA and has been authorized for detection and/or diagnosis of SARS-CoV-2 by FDA under an Emergency Use Authorization (EUA).  This EUA will remain in effect (meaning this test can be used) for the duration of the COVID-19 declaration under Section 564(b)(1) of the Act, 21 U.S.C. section 360bbb-3(b)(1), unless the authorization is terminated or revoked sooner.  Performed at [facility], [address] [place], [facility] [address]   Expectorated Sputum Assessment w Gram Stain, Rflx to Resp Cult     Status: None   Collection Time: 12/08/21  2:59 PM   Specimen: Sputum  Result Value Ref Range Status   Specimen Description SPUTUM  Final   Special Requests NONE  Final   Sputum evaluation  Final    THIS SPECIMEN IS ACCEPTABLE FOR SPUTUM CULTURE Performed at [facility], [address] [place], [place] [address]    Report Status 12/08/2021 FINAL  Final  Culture, Respiratory w Gram Stain     Status: None (Preliminary result)   Collection Time: 12/08/21  2:59 PM   Specimen: SPU  Result Value Ref Range Status   Specimen Description   Final    SPUTUM Performed at [facility], [address] [place], [place] [address]    Special Requests   Final    NONE Reflexed from [id number] Performed at [facility], [address] [place], [place] [address]    Gram Stain   Final    NO WBC SEEN RARE GRAM POSITIVE RODS RARE GRAM POSITIVE COCCI RARE GRAM NEGATIVE RODS    Culture   Final    CULTURE  REINCUBATED FOR BETTER GROWTH Performed at [facility], [address], [place], [place] [address]    Report Status PENDING  Incomplete         Radiology Studies: No results found.      Scheduled Meds:  amLODipine  5 mg Oral QHS   aspirin  81 mg Oral Daily   atorvastatin  10 mg Oral Daily   budesonide (PULMICORT) nebulizer solution  0.5 mg Nebulization BID   clopidogrel  75 mg Oral Daily   doxazosin  8 mg Oral QHS   enoxaparin (LOVENOX) injection  40 mg Subcutaneous Q24H   ipratropium-albuterol  3 mL Nebulization TID   irbesartan  75 mg Oral Daily   predniSONE  40 mg Oral Q breakfast   senna-docusate  1 tablet Oral BID   Continuous Infusions:  sodium chloride 100 mL/hr at 12/09/21 2138   azithromycin 500 mg (12/09/21 2138)   cefTRIAXone (ROCEPHIN)  IV 2 g (12/09/21 1743)          Glade Lloyd, MD [facility] 12/10/2021, 11:27 AM

## 2021-12-11 DIAGNOSIS — E785 Hyperlipidemia, unspecified: Secondary | ICD-10-CM

## 2021-12-11 DIAGNOSIS — E871 Hypo-osmolality and hyponatremia: Secondary | ICD-10-CM | POA: Diagnosis not present

## 2021-12-11 DIAGNOSIS — J441 Chronic obstructive pulmonary disease with (acute) exacerbation: Secondary | ICD-10-CM

## 2021-12-11 DIAGNOSIS — Z720 Tobacco use: Secondary | ICD-10-CM | POA: Diagnosis not present

## 2021-12-11 DIAGNOSIS — I10 Essential (primary) hypertension: Secondary | ICD-10-CM | POA: Diagnosis not present

## 2021-12-11 DIAGNOSIS — J449 Chronic obstructive pulmonary disease, unspecified: Secondary | ICD-10-CM

## 2021-12-11 DIAGNOSIS — K5900 Constipation, unspecified: Secondary | ICD-10-CM

## 2021-12-11 DIAGNOSIS — J189 Pneumonia, unspecified organism: Secondary | ICD-10-CM | POA: Diagnosis not present

## 2021-12-11 LAB — CULTURE, RESPIRATORY W GRAM STAIN
Culture: NORMAL
Gram Stain: NONE SEEN

## 2021-12-11 MED ORDER — METHYLPREDNISOLONE SODIUM SUCC 125 MG IJ SOLR
80.0000 mg | INTRAMUSCULAR | Status: DC
  Administered 2021-12-11 – 2021-12-12 (×2): 80 mg via INTRAVENOUS
  Filled 2021-12-11 (×2): qty 2

## 2021-12-11 MED ORDER — POLYETHYLENE GLYCOL 3350 17 G PO PACK
17.0000 g | PACK | Freq: Two times a day (BID) | ORAL | Status: DC
  Administered 2021-12-11 – 2021-12-12 (×2): 17 g via ORAL
  Filled 2021-12-11 (×2): qty 1

## 2021-12-11 MED ORDER — SENNOSIDES-DOCUSATE SODIUM 8.6-50 MG PO TABS
2.0000 | ORAL_TABLET | Freq: Two times a day (BID) | ORAL | Status: DC
  Administered 2021-12-11 – 2021-12-12 (×2): 2 via ORAL
  Filled 2021-12-11 (×2): qty 2

## 2021-12-11 NOTE — Progress Notes (Signed)
PROGRESS NOTE    Jason Carson  PPI:[id number] DOB: September 15, 1950 DOA: 12/07/2021 PCP: Christen Butter, NP   Brief Narrative:   71 y.o. male with medical history significant of RBBB, atherosclerosis, CAD, benign essential tremor, hyperlipidemia, COPD, tobacco abuse presented with worsening shortness of breath.  On presentation, he was saturating 91% on room air.  Chest x-ray showed mild streaky left basilar opacities with increased normal me hyperinflation due to emphysema.  He was started on IV antibiotics and oral steroids.    Assessment & Plan:   Possible community-acquired bacterial pneumonia Acute respiratory failure with hypoxia -COVID-19 testing negative on presentation. -Continue Rocephin and Zithromax.  Currently on 2L oxygen via nasal cannula.  Wean off as able.  Possible COPD with exacerbation Tobacco abuse -Still wheezing.  Switch prednisone to Solu-Medrol IV.  Continue nebs -Patient was counseled regarding tobacco cessation by admitting hospitalist  hypertension -Monitor blood pressure.  Continue amlodipine and irbesartan  Hyponatremia -Possibly from poor oral intake.  Improved, 136 on 12/10/2021.  No labs today.  Off IV fluids.  Hyperlipidemia -Continue statin  CAD -Continue statin and aspirin/Plavix.  Outpatient follow-up with cardiology.  No signs of chest pain  Leukocytosis -Resolved  Constipation -Had small bowel movement yesterday and this morning after MiraLAX.  We will continue bowel regimen.  DVT prophylaxis: Lovenox Code Status: Full Family Communication: None at bedside Disposition Plan: Status is: Inpatient Remains inpatient appropriate because: Of severity of illness  Consultants: None  Procedures: None  Antimicrobials: Rocephin and Zithromax from 12/25/2021 onwards   Subjective: Patient seen and examined at bedside.  Had very small bowel movement last night and this morning.  Still short of breath with exertion.  No fever or vomiting  reported. Objective: Vitals:   12/10/21 2110 12/10/21 2156 12/11/21 0520 12/11/21 0734  BP:  136/68 (!) 153/78   Pulse:  74 62   Resp: 18 18 17    Temp:  98.1 F (36.7 C) 98.2 F (36.8 C)   TempSrc:  Oral    SpO2:  91% 95% (!) 87%  Weight:   85.8 kg   Height:        Intake/Output Summary (Last 24 hours) at 12/11/2021 1025 Last data filed at 12/11/2021 0954 Gross per 24 hour  Intake 938 ml  Output 975 ml  Net -37 ml    Filed Weights   12/08/21 1323 12/10/21 0423 12/11/21 0520  Weight: 83.1 kg 85.1 kg 85.8 kg    Examination:  General: No acute distress.  Still on 2 L oxygen via nasal cannula.   ENT/neck: No palpable neck masses or JVD elevation noted  respiratory: Bilateral decreased breath sounds at bases with scattered crackles, some wheezing noted  CVS: Rate is mostly controlled; S1 and S2 are heard  abdominal: Soft, nontender, distended still; no organomegaly, bowel sounds are heard normally  extremities: No clubbing; mild lower extremity edema present  CNS: Alert and oriented.  No focal neurologic deficit.  Moving extremities  lymph: No cervical lymphadenopathy noted Skin: No obvious petechiae/rashes  psych: Mostly flat affect.  No signs of agitation. musculoskeletal: No obvious other joint swelling/deformity     Data Reviewed: I have personally reviewed following labs and imaging studies  CBC: Recent Labs  Lab 12/07/21 1616 12/08/21 1425 12/09/21 0538 12/10/21 0532  WBC 8.7 12.9* 9.1 9.7  NEUTROABS 6.6 10.1*  --  6.6  HGB 14.2 15.1 13.9 12.8*  HCT 37.9* 41.3 38.7* 35.7*  MCV 95.9 99.5 100.3* 100.3*  PLT 221 246 248 242  Basic Metabolic Panel: Recent Labs  Lab 12/07/21 1616 12/08/21 1425 12/09/21 0538 12/10/21 0532  NA 125* 130* 133* 136  K 3.7 3.8 4.4 3.7  CL 92* 92* 98 103  CO2 24 27 27 26   GLUCOSE 126* 95 127* 100*  BUN 14 12 14 15   CREATININE 0.71 0.84 0.73 0.70  CALCIUM 8.7* 9.2 8.8* 8.3*  MG  --  2.2  --  2.2  PHOS  --  3.2  --    --     GFR: Estimated Creatinine Clearance: 93 mL/min (by C-G formula based on SCr of 0.7 mg/dL). Liver Function Tests: Recent Labs  Lab 12/07/21 1616 12/08/21 1425 12/10/21 0532  AST 25 30 23   ALT 21 22 20   ALKPHOS 73 70 51  BILITOT 0.7 0.7 0.6  PROT 6.6 7.4 5.6*  ALBUMIN 3.5 3.9 3.1*    No results for input(s): "LIPASE", "AMYLASE" in the last 168 hours. No results for input(s): "AMMONIA" in the last 168 hours. Coagulation Profile: No results for input(s): "INR", "PROTIME" in the last 168 hours. Cardiac Enzymes: No results for input(s): "CKTOTAL", "CKMB", "CKMBINDEX", "TROPONINI" in the last 168 hours. BNP (last 3 results) No results for input(s): "PROBNP" in the last 8760 hours. HbA1C: No results for input(s): "HGBA1C" in the last 72 hours. CBG: No results for input(s): "GLUCAP" in the last 168 hours. Lipid Profile: No results for input(s): "CHOL", "HDL", "LDLCALC", "TRIG", "CHOLHDL", "LDLDIRECT" in the last 72 hours. Thyroid Function Tests: No results for input(s): "TSH", "T4TOTAL", "FREET4", "T3FREE", "THYROIDAB" in the last 72 hours. Anemia Panel: Recent Labs    12/08/21 1425  VITAMINB12 825  FOLATE 21.1    Sepsis Labs: No results for input(s): "PROCALCITON", "LATICACIDVEN" in the last 168 hours.  Recent Results (from the past 240 hour(s))  SARS Coronavirus 2 by RT PCR (hospital order, performed in [facility] hospital lab) *cepheid single result test* Anterior Nasal Swab     Status: None   Collection Time: 12/07/21  4:16 PM   Specimen: Anterior Nasal Swab  Result Value Ref Range Status   SARS Coronavirus 2 by RT PCR NEGATIVE NEGATIVE Final    Comment: (NOTE) SARS-CoV-2 target nucleic acids are NOT DETECTED.  The SARS-CoV-2 RNA is generally detectable in upper and lower respiratory specimens during the acute phase of infection. The lowest concentration of SARS-CoV-2 viral copies this assay can detect is 250 copies / mL. A negative result does not  preclude SARS-CoV-2 infection and should not be used as the sole basis for treatment or other patient management decisions.  A negative result may occur with improper specimen collection / handling, submission of specimen other than nasopharyngeal swab, presence of viral mutation(s) within the areas targeted by this assay, and inadequate number of viral copies (<250 copies / mL). A negative result must be combined with clinical observations, patient history, and epidemiological information.  Fact Sheet for Patients:    Fact Sheet for Healthcare Providers: 12/10/21  This test is not yet approved or  cleared by the [facility] FDA and has been authorized for detection and/or diagnosis of SARS-CoV-2 by FDA under an Emergency Use Authorization (EUA).  This EUA will remain in effect (meaning this test can be used) for the duration of the COVID-19 declaration under Section 564(b)(1) of the Act, 21 U.S.C. section 360bbb-3(b)(1), unless the authorization is terminated or revoked sooner.  Performed at [facility], [address] [place], [place] [facility]   Expectorated Sputum Assessment w  Gram Stain, Rflx to Resp Cult     Status: None   Collection Time: 12/08/21  2:59 PM   Specimen: Sputum  Result Value Ref Range Status   Specimen Description SPUTUM  Final   Special Requests NONE  Final   Sputum evaluation   Final    THIS SPECIMEN IS ACCEPTABLE FOR SPUTUM CULTURE Performed at [facility], [address], [place], [place] [address]    Report Status 12/08/2021 FINAL  Final  Culture, Respiratory w Gram Stain     Status: None   Collection Time: 12/08/21  2:59 PM   Specimen: SPU  Result Value Ref Range Status   Specimen Description   Final    SPUTUM Performed at [facility], [address], [place], [place] [address]    Special Requests   Final    NONE  Reflexed from [id number] Performed at [facility], [address], [place], [place] [address]    Gram Stain   Final    NO WBC SEEN RARE GRAM POSITIVE RODS RARE GRAM POSITIVE COCCI RARE GRAM NEGATIVE RODS    Culture   Final    RARE Normal respiratory flora-no Staph aureus or Pseudomonas seen Performed at [facility], [address] [place], [place] [address]    Report Status 12/11/2021 FINAL  Final         Radiology Studies: No results found.      Scheduled Meds:  amLODipine  5 mg Oral QHS   aspirin  81 mg Oral Daily   atorvastatin  10 mg Oral Daily   budesonide (PULMICORT) nebulizer solution  0.5 mg Nebulization BID   clopidogrel  75 mg Oral Daily   doxazosin  8 mg Oral QHS   enoxaparin (LOVENOX) injection  40 mg Subcutaneous Q24H   ipratropium-albuterol  3 mL Nebulization BID   irbesartan  75 mg Oral Daily   methylPREDNISolone (SOLU-MEDROL) injection  80 mg Intravenous Q24H   senna-docusate  1 tablet Oral BID   Continuous Infusions:  azithromycin 500 mg (12/10/21 2142)   cefTRIAXone (ROCEPHIN)  IV 2 g (12/10/21 1828)          Glade Lloyd, MD [facility] 12/11/2021, 10:25 AM

## 2021-12-12 ENCOUNTER — Other Ambulatory Visit (HOSPITAL_COMMUNITY): Payer: Self-pay

## 2021-12-12 DIAGNOSIS — K5900 Constipation, unspecified: Secondary | ICD-10-CM | POA: Diagnosis not present

## 2021-12-12 DIAGNOSIS — E785 Hyperlipidemia, unspecified: Secondary | ICD-10-CM

## 2021-12-12 DIAGNOSIS — J441 Chronic obstructive pulmonary disease with (acute) exacerbation: Secondary | ICD-10-CM

## 2021-12-12 DIAGNOSIS — J189 Pneumonia, unspecified organism: Secondary | ICD-10-CM | POA: Diagnosis not present

## 2021-12-12 DIAGNOSIS — I2510 Atherosclerotic heart disease of native coronary artery without angina pectoris: Secondary | ICD-10-CM | POA: Diagnosis not present

## 2021-12-12 LAB — LEGIONELLA PNEUMOPHILA SEROGP 1 UR AG: L. pneumophila Serogp 1 Ur Ag: NEGATIVE

## 2021-12-12 MED ORDER — PREDNISONE 20 MG PO TABS
40.0000 mg | ORAL_TABLET | Freq: Every day | ORAL | 0 refills | Status: AC
  Filled 2021-12-12 (×2): qty 14, 7d supply, fill #0

## 2021-12-12 MED ORDER — FLEET ENEMA 7-19 GM/118ML RE ENEM
1.0000 | ENEMA | Freq: Once | RECTAL | Status: DC
Start: 2021-12-12 — End: 2021-12-12

## 2021-12-12 MED ORDER — BUDESONIDE-FORMOTEROL FUMARATE 160-4.5 MCG/ACT IN AERO
INHALATION_SPRAY | RESPIRATORY_TRACT | 0 refills | Status: DC
  Filled 2021-12-12: qty 10.2, 30d supply, fill #0

## 2021-12-12 MED ORDER — FLUTICASONE-SALMETEROL 500-50 MCG/ACT IN AEPB
1.0000 | INHALATION_SPRAY | Freq: Two times a day (BID) | RESPIRATORY_TRACT | 0 refills | Status: DC
  Filled 2021-12-12: qty 60, 30d supply, fill #0

## 2021-12-12 MED ORDER — GUAIFENESIN-DM 100-10 MG/5ML PO SYRP
10.0000 mL | ORAL_SOLUTION | ORAL | 0 refills | Status: DC | PRN
  Filled 2021-12-12: qty 236, 4d supply, fill #0

## 2021-12-12 MED ORDER — ALBUTEROL SULFATE HFA 108 (90 BASE) MCG/ACT IN AERS
2.0000 | INHALATION_SPRAY | RESPIRATORY_TRACT | 0 refills | Status: DC | PRN
  Filled 2021-12-12: qty 6.7, 25d supply, fill #0
  Filled 2021-12-12: qty 8.5, 30d supply, fill #0

## 2021-12-12 NOTE — Progress Notes (Signed)
Awaiting cab voucher from [facility]

## 2021-12-12 NOTE — Progress Notes (Signed)
DC packet and instructions given to pt. All questions answered.

## 2021-12-12 NOTE — Telephone Encounter (Signed)
Rx request sent to pharmacy.  

## 2021-12-12 NOTE — Discharge Summary (Addendum)
Physician Discharge Summary  Jason Carson GYI:[id number] DOB: 1950-07-12 DOA: 12/07/2021  PCP: Christen Butter, NP  Admit date: 12/07/2021 Discharge date: 12/12/2021  Admitted From: Home Disposition: Home  Recommendations for Outpatient Follow-up:  Follow up with PCP in 1 week with  Outpatient evaluation and follow-up with pulmonary Follow up in ED if symptoms worsen or new appear   Home Health: No Equipment/Devices: None  Discharge Condition: Stable CODE STATUS: Full Diet recommendation: Heart healthy  Brief/Interim Summary: 71 y.o. male with medical history significant of RBBB, atherosclerosis, CAD, benign essential tremor, hyperlipidemia, COPD, tobacco abuse presented with worsening shortness of breath.  On presentation, he was saturating 91% on room air.  Chest x-ray showed mild streaky left basilar opacities with increased normal me hyperinflation due to emphysema.  He was started on IV antibiotics and oral steroids.  During the hospitalization, his condition has improved.  He feels better and feels okay to go home today.  He will be discharged today with close outpatient follow-up with PCP.  Discharge Diagnoses:   Possible community-acquired bacterial pneumonia Acute respiratory failure with hypoxia -COVID-19 testing negative on presentation. -Treated with 5 days of Rocephin and Zithromax.  Does not need any more antibiotics on discharge.  Possible COPD with exacerbation Tobacco abuse -Treated with prednisone and subsequently switched to IV Solu-Medrol.   -Patient was counseled regarding tobacco cessation by admitting hospitalist -Respiratory status stable currently on room air. -Discharge home today on oral prednisone 40 mg daily for 7 days.  Continue as needed albuterol inhaler.  Might need to start steroid inhaler as an outpatient.  Outpatient follow-up with PCP and pulmonary.   hypertension -Monitor blood pressure.  Continue amlodipine and irbesartan    Hyponatremia -Possibly from poor oral intake.  Improved, 136 on 12/10/2021.  No labs today.  Off IV fluids. -Keep holding hydrochlorothiazide on discharge.  Outpatient follow-up with PCP.  Hyperlipidemia -Continue statin  CAD -Continue statin and aspirin/Plavix.  Outpatient follow-up with cardiology.  No signs of chest pain  Leukocytosis -Resolved   Constipation - Improved. Patient can continue over-the-counter stool softeners/laxatives as needed.  Discharge Instructions  Allergies as of 12/12/2021   No Known Allergies      Medication List     STOP taking these medications    hydrochlorothiazide 12.5 MG tablet Commonly known as: HYDRODIURIL       TAKE these medications    acetaminophen 500 MG tablet Commonly known as: TYLENOL Take 1,000 mg by mouth every 6 (six) hours as needed for mild pain.   albuterol 108 (90 Base) MCG/ACT inhaler Commonly known as: VENTOLIN HFA Inhale 2 puffs into the lungs every 4 (four) hours as needed for wheezing or shortness of breath.   AMBULATORY NON FORMULARY MEDICATION Continuous positive airway pressure (CPAP) machine set on AutoPAP (4-20 cmH2O), with all supplemental supplies as needed.   amLODipine 5 MG tablet Commonly known as: NORVASC Take 1 tablet (5 mg total) by mouth in the morning and at bedtime.   Aspirin 81 MG Caps Take 81 mg by mouth daily.   atorvastatin 10 MG tablet Commonly known as: LIPITOR TAKE 1 TABLET BY MOUTH EVERY DAY   clopidogrel 75 MG tablet Commonly known as: PLAVIX TAKE 1 TABLET BY MOUTH EVERY DAY   doxazosin 8 MG tablet Commonly known as: CARDURA Take 1 tablet (8 mg total) by mouth at bedtime. NEEDS APPOINTMENT FOR FURTHER REFILLS.   guaiFENesin-dextromethorphan 100-10 MG/5ML syrup Commonly known as: ROBITUSSIN DM Take 10 mLs by mouth every 4 (four) hours  as needed for cough (chest congestion).   multivitamin with minerals Tabs tablet Take 1 tablet by mouth daily.   predniSONE 20 MG  tablet Commonly known as: DELTASONE Take 2 tablets  by mouth daily with breakfast for 7 days.   valsartan 80 MG tablet Commonly known as: DIOVAN Take 80 mg by mouth at bedtime.            No Known Allergies  Consultations: None   Procedures/Studies: CT Angio Chest PE W/Cm &/Or Wo Cm  Result Date: 12/07/2021 CLINICAL DATA:  Pulmonary embolism (PE) suspected, high prob. Fever, cough, shortness of breath EXAM: CT ANGIOGRAPHY CHEST WITH CONTRAST TECHNIQUE: Multidetector CT imaging of the chest was performed using the standard protocol during bolus administration of intravenous contrast. Multiplanar CT image reconstructions and MIPs were obtained to evaluate the vascular anatomy. RADIATION DOSE REDUCTION: This exam was performed according to the departmental dose-optimization program which includes automated exposure control, adjustment of the mA and/or kV according to patient size and/or use of iterative reconstruction technique. CONTRAST:  75mL OMNIPAQUE IOHEXOL 350 MG/ML SOLN COMPARISON:  01/19/2021 FINDINGS: Cardiovascular: No filling defects in the pulmonary arteries to suggest pulmonary emboli. Heart is normal size. Aorta is normal caliber. Coronary artery and aortic calcifications. Mediastinum/Nodes: No mediastinal, hilar, or axillary adenopathy. Trachea and esophagus are unremarkable. Thyroid unremarkable. Lungs/Pleura: Nodular opacity in the superior segment of the right lower lobe 2 cm. This could be infectious/inflammatory as there are other ground-glass opacities also in the right lower lobe. Left lung clear. No effusions. Upper Abdomen: No acute findings Musculoskeletal: Chest wall soft tissues are unremarkable. No acute bony abnormality. Review of the MIP images confirms the above findings. IMPRESSION: No evidence of pulmonary embolus. Coronary artery disease. Patchy ground-glass and nodular opacities in the right lower lobe. Favor pneumonia. Followup chest CT is recommended in 3-4  weeks following trial of antibiotic therapy to ensure resolution and exclude underlying malignancy. Aortic Atherosclerosis (ICD10-I70.0). Electronically Signed   By: Charlett NoseKevin  Dover M.D.   On: 12/07/2021 19:29   DG Chest 2 View  Result Date: 12/07/2021 CLINICAL DATA:  Shortness of breath. EXAM: CHEST - 2 VIEW COMPARISON:  CT chest 01/19/2021. FINDINGS: Mild streaky left basilar opacities, favor atelectasis. Linear hazy opacification in the mid lungs bilaterally. Hyperinflation. No visible pleural effusions or pneumothorax. Biapical pleuroparenchymal scarring. Cardiomediastinal silhouette is within normal limits. No acute osseous abnormality. IMPRESSION: 1. Mild streaky left basilar opacities, favor atelectasis. 2. Linear hazy opacification in the mid lungs bilaterally is favored to represent attenuation from overlapping soft tissues. 3. Hyperinflation, compatible with emphysema. Electronically Signed   By: Feliberto HartsFrederick S Jones M.D.   On: 12/07/2021 16:50      Subjective: Patient seen and examined at bedside.  Feels better and wants to go home today.  No overnight fever, nausea vomiting.  Still short of breath with some exertion.  Discharge Exam: Vitals:   12/11/21 2112 12/12/21 0410  BP: (!) 153/78 (!) 162/81  Pulse: 81 70  Resp: 16 15  Temp: 98.6 F (37 C) 98.1 F (36.7 C)  SpO2: 96% 99%    General: Pt is alert, awake, not in acute distress.  Currently on 2 L oxygen via nasal cannula Cardiovascular: rate controlled, S1/S2 + Respiratory: bilateral decreased breath sounds at bases with scattered crackles Abdominal: Soft, NT, ND, bowel sounds + Extremities: Trace lower extremity edema present; no cyanosis    The results of significant diagnostics from this hospitalization (including imaging, microbiology, ancillary and laboratory) are listed below for  reference.     Microbiology: Recent Results (from the past 240 hour(s))  SARS Coronavirus 2 by RT PCR (hospital order, performed in [facility] hospital lab) *cepheid single result test* Anterior Nasal Swab     Status: None   Collection Time: 12/07/21  4:16 PM   Specimen: Anterior Nasal Swab  Result Value Ref Range Status   SARS Coronavirus 2 by RT PCR NEGATIVE NEGATIVE Final    Comment: (NOTE) SARS-CoV-2 target nucleic acids are NOT DETECTED.  The SARS-CoV-2 RNA is generally detectable in upper and lower respiratory specimens during the acute phase of infection. The lowest concentration of SARS-CoV-2 viral copies this assay can detect is 250 copies / mL. A negative result does not preclude SARS-CoV-2 infection and should not be used as the sole basis for treatment or other patient management decisions.  A negative result may occur with improper specimen collection / handling, submission of specimen other than nasopharyngeal swab, presence of viral mutation(s) within the areas targeted by this assay, and inadequate number of viral copies (<250 copies / mL). A negative result must be combined with clinical observations, patient history, and epidemiological information.  Fact Sheet for Patients:   RoadLapTop.co.za  Fact Sheet for Healthcare Providers: http://kim-miller.com/  This test is not yet approved or  cleared by the [place] FDA and has been authorized for detection and/or diagnosis of SARS-CoV-2 by FDA under an Emergency Use Authorization (EUA).  This EUA will remain in effect (meaning this test can be used) for the duration of the COVID-19 declaration under Section 564(b)(1) of the Act, 21 U.S.C. section 360bbb-3(b)(1), unless the authorization is terminated or revoked sooner.  Performed at [facility], [address] [place], [place] [address]   Expectorated Sputum Assessment w Gram Stain, Rflx to Resp Cult     Status: None   Collection Time: 12/08/21  2:59 PM   Specimen: Sputum  Result Value Ref Range Status   Specimen Description SPUTUM   Final   Special Requests NONE  Final   Sputum evaluation   Final    THIS SPECIMEN IS ACCEPTABLE FOR SPUTUM CULTURE Performed at [facility], [address], [place], [place] [address]    Report Status 12/08/2021 FINAL  Final  Culture, Respiratory w Gram Stain     Status: None   Collection Time: 12/08/21  2:59 PM   Specimen: SPU  Result Value Ref Range Status   Specimen Description   Final    SPUTUM Performed at [facility], [address] [place], [place] [address]    Special Requests   Final    NONE Reflexed from [id number] Performed at [facility], [address] [place], [place] [address]    Gram Stain   Final    NO WBC SEEN RARE GRAM POSITIVE RODS RARE GRAM POSITIVE COCCI RARE GRAM NEGATIVE RODS    Culture   Final    RARE Normal respiratory flora-no Staph aureus or Pseudomonas seen Performed at [facility], [address] [place], [place] [address]    Report Status 12/11/2021 FINAL  Final     Labs: BNP (last 3 results) Recent Labs    12/07/21 1616  BNP 50.2   Basic Metabolic Panel: Recent Labs  Lab 12/07/21 1616 12/08/21 1425 12/09/21 0538 12/10/21 0532  NA 125* 130* 133* 136  K 3.7 3.8 4.4 3.7  CL 92* 92* 98 103  CO2 24 27 27 26   GLUCOSE  126* 95 127* 100*  BUN 14 12 14 15   CREATININE 0.71 0.84 0.73 0.70  CALCIUM 8.7* 9.2 8.8* 8.3*  MG  --  2.2  --  2.2  PHOS  --  3.2  --   --    Liver Function Tests: Recent Labs  Lab 12/07/21 1616 12/08/21 1425 12/10/21 0532  AST 25 30 23   ALT 21 22 20   ALKPHOS 73 70 51  BILITOT 0.7 0.7 0.6  PROT 6.6 7.4 5.6*  ALBUMIN 3.5 3.9 3.1*   No results for input(s): "LIPASE", "AMYLASE" in the last 168 hours. No results for input(s): "AMMONIA" in the last 168 hours. CBC: Recent Labs  Lab 12/07/21 1616 12/08/21 1425 12/09/21 0538 12/10/21 0532  WBC 8.7 12.9* 9.1 9.7  NEUTROABS 6.6 10.1*  --  6.6  HGB 14.2 15.1 13.9 12.8*  HCT 37.9* 41.3 38.7*  35.7*  MCV 95.9 99.5 100.3* 100.3*  PLT 221 246 248 242   Cardiac Enzymes: No results for input(s): "CKTOTAL", "CKMB", "CKMBINDEX", "TROPONINI" in the last 168 hours. BNP: Invalid input(s): "POCBNP" CBG: No results for input(s): "GLUCAP" in the last 168 hours. D-Dimer No results for input(s): "DDIMER" in the last 72 hours. Hgb A1c No results for input(s): "HGBA1C" in the last 72 hours. Lipid Profile No results for input(s): "CHOL", "HDL", "LDLCALC", "TRIG", "CHOLHDL", "LDLDIRECT" in the last 72 hours. Thyroid function studies No results for input(s): "TSH", "T4TOTAL", "T3FREE", "THYROIDAB" in the last 72 hours.  Invalid input(s): "FREET3" Anemia work up No results for input(s): "VITAMINB12", "FOLATE", "FERRITIN", "TIBC", "IRON", "RETICCTPCT" in the last 72 hours. Urinalysis No results found for: "COLORURINE", "APPEARANCEUR", "LABSPEC", "PHURINE", "GLUCOSEU", "HGBUR", "BILIRUBINUR", "KETONESUR", "PROTEINUR", "UROBILINOGEN", "NITRITE", "LEUKOCYTESUR" Sepsis Labs Recent Labs  Lab 12/07/21 1616 12/08/21 1425 12/09/21 0538 12/10/21 0532  WBC 8.7 12.9* 9.1 9.7   Microbiology Recent Results (from the past 240 hour(s))  SARS Coronavirus 2 by RT PCR (hospital order, performed in [facility] hospital lab) *cepheid single result test* Anterior Nasal Swab     Status: None   Collection Time: 12/07/21  4:16 PM   Specimen: Anterior Nasal Swab  Result Value Ref Range Status   SARS Coronavirus 2 by RT PCR NEGATIVE NEGATIVE Final    Comment: (NOTE) SARS-CoV-2 target nucleic acids are NOT DETECTED.  The SARS-CoV-2 RNA is generally detectable in upper and lower respiratory specimens during the acute phase of infection. The lowest concentration of SARS-CoV-2 viral copies this assay can detect is 250 copies / mL. A negative result does not preclude SARS-CoV-2 infection and should not be used as the sole basis for treatment or other patient management decisions.  A negative result may occur  with improper specimen collection / handling, submission of specimen other than nasopharyngeal swab, presence of viral mutation(s) within the areas targeted by this assay, and inadequate number of viral copies (<250 copies / mL). A negative result must be combined with clinical observations, patient history, and epidemiological information.  Fact Sheet for Patients:   12/12/21  Fact Sheet for Healthcare Providers: [facility]  This test is not yet approved or  cleared by the 02/07/22 FDA and has been authorized for detection and/or diagnosis of SARS-CoV-2 by FDA under an Emergency Use Authorization (EUA).  This EUA will remain in effect (meaning this test can be used) for the duration of the COVID-19 declaration under Section 564(b)(1) of the Act, 21 U.S.C. section 360bbb-3(b)(1), unless the authorization is terminated or revoked sooner.  Performed at [facility], http://kim-miller.com/  [address] [place], [place] [address]   Expectorated Sputum Assessment w Gram Stain, Rflx to Resp Cult     Status: None   Collection Time: 12/08/21  2:59 PM   Specimen: Sputum  Result Value Ref Range Status   Specimen Description SPUTUM  Final   Special Requests NONE  Final   Sputum evaluation   Final    THIS SPECIMEN IS ACCEPTABLE FOR SPUTUM CULTURE Performed at [facility], [address] [place], [place] [address]    Report Status 12/08/2021 FINAL  Final  Culture, Respiratory w Gram Stain     Status: None   Collection Time: 12/08/21  2:59 PM   Specimen: SPU  Result Value Ref Range Status   Specimen Description   Final    SPUTUM Performed at [facility], [address] [place], [place] [address]    Special Requests   Final    NONE Reflexed from [id number] Performed at [facility] [address], [place], [place] [address]    Gram Stain   Final    NO WBC SEEN RARE  GRAM POSITIVE RODS RARE GRAM POSITIVE COCCI RARE GRAM NEGATIVE RODS    Culture   Final    RARE Normal respiratory flora-no Staph aureus or Pseudomonas seen Performed at [facility], [address], [place], [place] [address]    Report Status 12/11/2021 FINAL  Final     Time coordinating discharge: 35 minutes  SIGNED:   Glade Lloyd, MD  [facility] 12/12/2021, 7:45 AM

## 2021-12-12 NOTE — Progress Notes (Signed)
SATURATION QUALIFICATIONS: (This note is used to comply with regulatory documentation for home oxygen)  Patient Saturations on Room Air at Rest = 95%  Patient Saturations on Room Air while Ambulating = 93%   

## 2021-12-12 NOTE — Care Management Important Message (Signed)
Important Message  Patient Details IM Letter given to the Patient Name: Jason Carson MRN: [id number] Date of Birth: 1951-05-29   Medicare Important Message Given:  Yes     Caren Macadam 12/12/2021, 12:01 PM

## 2021-12-14 ENCOUNTER — Other Ambulatory Visit (HOSPITAL_BASED_OUTPATIENT_CLINIC_OR_DEPARTMENT_OTHER): Payer: Medicare Other

## 2021-12-21 ENCOUNTER — Ambulatory Visit (INDEPENDENT_AMBULATORY_CARE_PROVIDER_SITE_OTHER): Payer: Medicare Other

## 2021-12-21 DIAGNOSIS — R0602 Shortness of breath: Secondary | ICD-10-CM | POA: Diagnosis not present

## 2021-12-21 DIAGNOSIS — I10 Essential (primary) hypertension: Secondary | ICD-10-CM | POA: Diagnosis not present

## 2021-12-21 DIAGNOSIS — R609 Edema, unspecified: Secondary | ICD-10-CM

## 2021-12-21 LAB — ECHOCARDIOGRAM COMPLETE
AR max vel: 2.96 cm2
AV Area VTI: 2.73 cm2
AV Area mean vel: 2.88 cm2
AV Mean grad: 5 mmHg
AV Peak grad: 9.2 mmHg
Ao pk vel: 1.52 m/s
Area-P 1/2: 3.48 cm2
S' Lateral: 2.53 cm

## 2022-01-02 ENCOUNTER — Ambulatory Visit (INDEPENDENT_AMBULATORY_CARE_PROVIDER_SITE_OTHER): Payer: Medicare Other | Admitting: Internal Medicine

## 2022-01-02 ENCOUNTER — Encounter: Payer: Self-pay | Admitting: Internal Medicine

## 2022-01-02 VITALS — BP 130/70 | HR 91 | Temp 98.2°F | Ht 72.0 in | Wt 185.6 lb

## 2022-01-02 DIAGNOSIS — F17200 Nicotine dependence, unspecified, uncomplicated: Secondary | ICD-10-CM

## 2022-01-02 DIAGNOSIS — R911 Solitary pulmonary nodule: Secondary | ICD-10-CM

## 2022-01-02 DIAGNOSIS — J449 Chronic obstructive pulmonary disease, unspecified: Secondary | ICD-10-CM | POA: Diagnosis not present

## 2022-01-02 DIAGNOSIS — J4489 Other specified chronic obstructive pulmonary disease: Secondary | ICD-10-CM

## 2022-01-02 NOTE — Patient Instructions (Addendum)
Please schedule follow up scheduled with myself in 1 months.  If my schedule is not open yet, we will contact you with a reminder closer to that time. Please call [phone] if you haven't heard from [place] a month before.   Before your next visit I would like you to have: Full set of PFTs - 1 hour, appointment with me afterwards.  Your CT scan is already scheduled so I will see you back after both of these tests are done.   Take the albuterol rescue inhaler every 4 to 6 hours as needed for wheezing or shortness of breath. You can also take it 15 minutes before exercise or exertional activity. Side effects include heart racing or pounding, jitters or anxiety. If you have a history of an irregular heart rhythm, it can make this worse. Can also give some patients a hard time sleeping.   Understanding COPD   What is COPD? COPD stands for chronic obstructive pulmonary (lung) disease. COPD is a general term used for several lung diseases.  COPD is an umbrella term and encompasses other  common diseases in this group like chronic bronchitis and emphysema. Chronic asthma may also be included in this group. While some patients with COPD have only chronic bronchitis or emphysema, most patients have a combination of both.  You might hear these terms used in exchange for one another.   COPD adds to the work of the heart. Diseased lungs may reduce the amount of oxygen that goes to the blood. High blood pressure in blood vessels from the heart to the lungs makes it difficult for the heart to pump. Lung disease can also cause the body to produce too many red blood cells which may make the blood thicker and harder to pump.   Patients who have COPD with low oxygen levels may develop an enlarged heart (cor pulmonale). This condition weakens the heart and causes increased shortness of breath and swelling in the legs and feet.   Chronic bronchitis Chronic bronchitis is irritation and inflammation (swelling) of  the lining in the bronchial tubes (air passages). The irritation causes coughing and an excess amount of mucus in the airways. The swelling makes it difficult to get air in and out of the lungs. The small, hair-like structures on the inside of the airways (called cilia) may be damaged by the irritation. The cilia are then unable to help clean mucus from the airways.  Bronchitis is generally considered to be chronic when you have: a productive cough (cough up mucus) and shortness of breath that lasts about 3 months or more each year for 2 or more years in a row. Your doctor may define chronic bronchitis differently.   Emphysema Emphysema is the destruction, or breakdown, of the walls of the alveoli (air sacs) located at the end of the bronchial tubes. The damaged alveoli are not able to exchange oxygen and carbon dioxide between the lungs and the blood. The bronchioles lose their elasticity and collapse when you exhale, trapping air in the lungs. The trapped air keeps fresh air and oxygen from entering the lungs.   Who is affected by COPD? Emphysema and chronic bronchitis affect approximately 16 million people in the [place], or close to 11 percent of the population.   Symptoms of COPD  Shortness of breath  Shortness of breath with mild exercise (walking, using the stairs, etc.)  Chronic, productive cough (with mucus)  A feeling of "tightness" in the chest  Wheezing   What  causes COPD? The two primary causes of COPD are cigarette smoking and alpha1-antitrypsin (AAT) deficiency. Air pollution and occupational dusts may also contribute to COPD, especially when the person exposed to these substances is a cigarette smoker.  Cigarette smoke causes COPD by irritating the airways and creating inflammation that narrows the airways, making it more difficult to breathe. Cigarette smoke also causes the cilia to stop working properly so mucus and trapped particles are not cleaned from the airways. As a  result, chronic cough and excess mucus production develop, leading to chronic bronchitis.  In some people, chronic bronchitis and infections can lead to destruction of the small airways, or emphysema.  AAT deficiency, an inherited disorder, can also lead to emphysema. Alpha antitrypsin (AAT) is a protective material produced in the liver and transported to the lungs to help combat inflammation. When there is not enough of the chemical AAT, the body is no longer protected from an enzyme in the white blood cells.   How is COPD diagnosed?  To diagnose COPD, the physician needs to know: Do you smoke?  Have you had chronic exposure to dust or air pollutants?  Do other members of your family have lung disease?  Are you short of breath?  Do you get short of breath with exercise?  Do you have chronic cough and/or wheezing?  Do you cough up excess mucus?  To help with the diagnosis, the physician will conduct a thorough physical exam which includes:  Listening to your lungs and heart  Checking your blood pressure and pulse  Examining your nose and throat  Checking your feet and ankles for swelling   Laboratory and other tests Several laboratory and other tests are needed to confirm a diagnosis of COPD. These tests may include:  Chest X-ray to look for lung changes that could be caused by COPD   Spirometry and pulmonary function tests (PFTs) to determine lung volume and air flow  Pulse oximetry to measure the saturation of oxygen in the blood  Arterial blood gases (ABGs) to determine the amount of oxygen and carbon dioxide in the blood  Exercise testing to determine if the oxygen level in the blood drops during exercise   Treatment In the beginning stages of COPD, there is minimal shortness of breath that may be noticed only during exercise. As the disease progresses, shortness of breath may worsen and you may need to wear an oxygen device.   To help control other symptoms of COPD, the following  treatments and lifestyle changes may be prescribed.  Quitting smoking  Avoiding cigarette smoke and other irritants  Taking medications including: a. bronchodilators b. anti-inflammatory agents c. oxygen d. antibiotics  Maintaining a healthy diet  Following a structured exercise program such as pulmonary rehabilitation Preventing respiratory infections  Controlling stress   If your COPD progresses, you may be eligible to be evaluated for lung volume reduction surgery or lung transplantation. You may also be eligible to participate in certain clinical trials (research studies). Ask your health care providers about studies being conducted in your hospital.   What is the outlook? Although COPD can not be cured, its symptoms can be treated and your quality of life can be improved. Your prognosis or outlook for the future will depend on how well your lungs are functioning, your symptoms, and how well you respond to and follow your treatment plan.   What are the benefits of quitting smoking? Quitting smoking can lower your chances of getting or dying from heart  disease, lung disease, kidney failure, infection, or cancer. It can also lower your chances of getting osteoporosis, a condition that makes your bones weak. Plus, quitting smoking can help your skin look younger and reduce the chances that you will have problems with sex.  Quitting smoking will improve your health no matter how old you are, and no matter how long or how much you have smoked.  What should I do if I want to quit smoking? The letters in the word "START" can help you remember the steps to take: S = Set a quit date. T = Tell family, friends, and the people around you that you plan to quit. A = Anticipate or plan ahead for the tough times you'll face while quitting. R = Remove cigarettes and other tobacco products from your home, car, and work. T = Talk to your doctor about getting help to quit.  How can my doctor or nurse  help? Your doctor or nurse can give you advice on the best way to quit. He or she can also put you in touch with counselors or other people you can call for support. Plus, your doctor or nurse can give you medicines to: ?Reduce your craving for cigarettes ?Reduce the unpleasant symptoms that happen when you stop smoking (called "withdrawal symptoms"). You can also get help from a free phone line (1-800-QUIT-NOW) or go online to [employer].  What are the symptoms of withdrawal? The symptoms include: ?Trouble sleeping ?Being irritable, anxious or restless ?Getting frustrated or angry ?Having trouble thinking clearly  Some people who stop smoking become temporarily depressed. Some people need treatment for depression, such as counseling or antidepressant medicines. Depressed people might: ?No longer enjoy or care about doing the things they used to like to do ?Feel sad, down, hopeless, nervous, or cranky most of the day, almost every day ?Lose or gain weight ?Sleep too much or too little ?Feel tired or like they have no energy ?Feel guilty or like they are worth nothing ?Forget things or feel confused ?Move and speak more slowly than usual ?Act restless or have trouble staying still ?Think about death or suicide  If you think you might be depressed, see your doctor or nurse. Only someone trained in mental health can tell for sure if you are depressed. If you ever feel like you might hurt yourself, go straight to the nearest emergency department. Or you can call for an ambulance (in the [place] and [place], dial 9-1-1) or call your doctor or nurse right away and tell them it is an emergency. You can also reach the [place] National Suicide Prevention Lifeline at (712)205-1753 or http://hill.com/.  How do medicines help you stop smoking? Different medicines work in different ways: ?Nicotine replacement therapy eases withdrawal and reduces your body's craving for nicotine, the  main drug found in cigarettes. There are different forms of nicotine replacement, including skin patches, lozenges, gum, nasal sprays, and "puffers" or inhalers. Many can be bought without a prescription, while others might require one. ?Bupropion is a prescription medicine that reduces your desire to smoke. This medicine is sold under the brand names Zyban and Wellbutrin. It is also available in a generic version, which is cheaper than brand name medicines. ?Varenicline (brand names: Chantix, Champix) is a prescription medicine that reduces withdrawal symptoms and cigarette cravings. If you think you'd like to take varenicline and you have a history of depression, anxiety, or heart disease, discuss this with your doctor or nurse before taking the medicine. Varenicline  can also increase the effects of alcohol in some people. It's a good idea to limit drinking while you're taking it, at least until you know how it affects you.  How does counseling work? Counseling can happen during formal office visits or just over the phone. A counselor can help you: ?Figure out what triggers your smoking and what to do instead ?Overcome cravings ?Figure out what went wrong when you tried to quit before  What works best? Studies show that people have the best luck at quitting if they take medicines to help them quit and work with a Veterinary surgeon. It might also be helpful to combine nicotine replacement with one of the prescription medicines that help people quit. In some cases, it might even make sense to take bupropion and varenicline together.  What about e-cigarettes? Sometimes people wonder if using electronic cigarettes, or "e-cigarettes," might help them quit smoking. Using e-cigarettes is also called "vaping." Doctors do not recommend e-cigarettes in place of medicines and counseling. That's because e-cigarettes still contain nicotine as well as other substances that might be harmful. It's not clear how they can  affect a person's health in the long term.  Will I gain weight if I quit? Yes, you might gain a few pounds. But quitting smoking will have a much more positive effect on your health than weighing a few pounds more. Plus, you can help prevent some weight gain by being more active and eating less. Taking the medicine bupropion might help control weight gain.   What else can I do to improve my chances of quitting? You can: ?Start exercising. ?Stay away from smokers and places that you associate with smoking. If people close to you smoke, ask them to quit with you. ?Keep gum, hard candy, or something to put in your mouth handy. If you get a craving for a cigarette, try one of these instead. ?Don't give up, even if you start smoking again. It takes most people a few tries before they succeed.  What if I am pregnant and I smoke? If you are pregnant, it's really important for the health of your baby that you quit. Ask your doctor what options you have, and what is safest for your baby

## 2022-01-02 NOTE — Progress Notes (Signed)
Jason Carson    [id number]    1950/08/09  Primary Care Physician:Jessup, [name], NP  Referring Physician: Glade Lloyd, MD [address] [place],  [place] [address] Reason for Consultation: shortness of breath Date of Consultation: 01/02/2022  Chief complaint:   Chief Complaint  Patient presents with   Consult    Consult: Cough, SOB from hosp. visit. Feeling good now. CPAP DME: [facility] 6-11 cm     HPI: Jason Carson is a 71 y.o. man who presents for new patient evaluation of COPD. History of PVD s/p bilateral iliac stenting. He was hospitalized for COPD exacerbation treated with steroids and antibiotics. Had CT Angio which was negative for PE. Had a nodular opacities in the RLL which requires follow up. Discharged on room air. He was discharged with albuterol inhaler. Since hospitalization he hasn't needed to take the albuterol and he is feeling better.   Denies previous pna or bronchitis. Prior to this hospital stay he has been having worsening dyspnea on exertion.      01/02/2022    3:51 PM  CAT Score  Total CAT Score 15     Social history:  Occupation: retired, Theatre manager Exposures: lives at home independently.  Smoking history: 50 years x 1 ppd = 50 pack years. Currently smoking about 1/2 ppd.   Social History   Occupational History    Comment: Retired.  Tobacco Use   Smoking status: Every Day    Packs/day: 1.00    Years: 30.00    Total pack years: 30.00    Types: Cigarettes   Smokeless tobacco: Never   Tobacco comments:    Smokes 3-4 packs a week. Tay 01/02/2022  Vaping Use   Vaping Use: Never used  Substance and Sexual Activity   Alcohol use: Yes    Alcohol/week: 8.0 - 10.0 standard drinks of alcohol    Types: 8 - 10 Standard drinks or equivalent per week   Drug use: Never   Sexual activity: Yes    Relevant family history:  Family History  Problem Relation Age of Onset   Hypertension Mother    Hyperlipidemia Mother     Hypertension Father     Past Medical History:  Diagnosis Date   Atherosclerosis    Benign essential tremor    CAD in native artery 08/17/2021   Lower extremity edema 12/05/2021   Pure hypercholesterolemia 08/17/2021   RBBB 04/12/2021   Tobacco abuse 04/12/2021    Past Surgical History:  Procedure Laterality Date   ABDOMINAL AORTOGRAM W/LOWER EXTREMITY N/A 01/28/2020   Procedure: ABDOMINAL AORTOGRAM W/LOWER EXTREMITY;  Surgeon: Cephus Shelling, MD;  Location: [facility];  Service: Cardiovascular;  Laterality: N/A;   PERIPHERAL VASCULAR INTERVENTION Bilateral 01/28/2020   Procedure: PERIPHERAL VASCULAR INTERVENTION;  Surgeon: Cephus Shelling, MD;  Location: [facility] INVASIVE CV [facility];  Service: Cardiovascular;  Laterality: Bilateral;  external iliac stents     Physical Exam: Blood pressure 130/70, pulse 91, temperature 98.2 F (36.8 C), temperature source Oral, height 6' (1.829 m), weight 185 lb 9.6 oz (84.2 kg), SpO2 95 %. Gen:      No acute distress ENT:  no nasal polyps, mucus membranes moist Lungs:    No increased respiratory effort, symmetric chest wall excursion, diminished, clear CV:         Regular rate and rhythm; no murmurs, rubs, or gallops.  No pedal edema Abd:      + bowel sounds; soft,  non-tender; no distension MSK: no acute synovitis of DIP or PIP joints, no mechanics hands.  Skin:      Warm and dry; no rashes Neuro: normal speech, no focal facial asymmetry Psych: alert and oriented x3, normal mood and affect   Data Reviewed/Medical Decision Making:  Independent interpretation of tests: Imaging:  Review of patient's CTPE images July 2023 revealed upper lobe emphysema, RLL pulmonary nodule. The patient's images have been independently reviewed by me.    PFTs:   Labs:  Lab Results  Component Value Date   WBC 9.7 12/10/2021   HGB 12.8 (L) 12/10/2021   HCT 35.7 (L) 12/10/2021   MCV 100.3 (H) 12/10/2021   PLT 242 12/10/2021   Lab Results  Component  Value Date   NA 136 12/10/2021   K 3.7 12/10/2021   CL 103 12/10/2021   CO2 26 12/10/2021     Immunization status:  Immunization History  Administered Date(s) Administered   Fluad Quad(high Dose 65+) 02/09/2020   Janssen (J&J) SARS-COV-2 Vaccination 09/05/2019     I reviewed prior external note(s) from hospital stay  I reviewed the result(s) of the labs and imaging as noted above.   I have ordered PFTs   Assessment:  COPD new diagnosis OSA on CPAP Solitary pulmonary nodule  Plan/Recommendations:  Follow up ct scan scheduled for two weeks from now for nodule.  Will get PFTs for COPD.  He wants to continue albuterol inhaler for now and not start maintenance.  Does not want medication right now for smoking cessation will cut back on his own.   We discussed disease management and progression at length today.   Smoking Cessation Counseling:  1. The patient is an everyday smoker and symptomatic due to the following condition copd 2. The patient is currently contemplative in quitting smoking. 3. I advised patient to quit smoking. 4. We identified patient specific barriers to change.  5. I personally spent 3 minutes counseling the patient regarding tobacco use disorder. 6. We discussed management of stress and anxiety to help with smoking cessation, when applicable. 7. We discussed nicotine replacement therapy, Wellbutrin, Chantix as possible options. 8. I advised setting a quit date. 9. Follow?up arranged with our office to continue ongoing discussions. 10.Resources given to patient including quit hotline.    Return to Care: Return in about 2 months (around 03/04/2022).  Durel Salts, MD Pulmonary and Critical Care Medicine [facility] Office:[phone]  CC: Glade Lloyd, MD

## 2022-01-03 ENCOUNTER — Ambulatory Visit (INDEPENDENT_AMBULATORY_CARE_PROVIDER_SITE_OTHER): Payer: Medicare Other | Admitting: Medical-Surgical

## 2022-01-03 ENCOUNTER — Encounter: Payer: Self-pay | Admitting: Medical-Surgical

## 2022-01-03 ENCOUNTER — Other Ambulatory Visit: Payer: Self-pay | Admitting: Medical-Surgical

## 2022-01-03 VITALS — BP 106/66 | HR 83 | Resp 20 | Ht 72.0 in | Wt 188.0 lb

## 2022-01-03 DIAGNOSIS — J441 Chronic obstructive pulmonary disease with (acute) exacerbation: Secondary | ICD-10-CM

## 2022-01-03 DIAGNOSIS — G4733 Obstructive sleep apnea (adult) (pediatric): Secondary | ICD-10-CM | POA: Diagnosis not present

## 2022-01-03 DIAGNOSIS — E785 Hyperlipidemia, unspecified: Secondary | ICD-10-CM

## 2022-01-03 DIAGNOSIS — Z9989 Dependence on other enabling machines and devices: Secondary | ICD-10-CM

## 2022-01-03 DIAGNOSIS — Z720 Tobacco use: Secondary | ICD-10-CM | POA: Diagnosis not present

## 2022-01-03 DIAGNOSIS — I10 Essential (primary) hypertension: Secondary | ICD-10-CM | POA: Diagnosis not present

## 2022-01-03 MED ORDER — DOXAZOSIN MESYLATE 8 MG PO TABS: 8.0000 mg | ORAL_TABLET | Freq: Every day | ORAL | 1 refills | Status: DC

## 2022-01-03 NOTE — Progress Notes (Signed)
Established Patient Office Visit  Subjective   Patient ID: Jason Carson, male   DOB: 1951/04/08 Age: 71 y.o. MRN: [id number]   Chief Complaint  Patient presents with   Medication Refill    HPI Very pleasant 71 year old male presenting today for follow-up.  He has been closely monitored over the last months by the advanced hypertension clinic who has been able to get his blood pressure fairly well under control.  He is compliant with his medications and is working to wear his CPAP nightly as prescribed.  Notes that getting used to his CPAP has been extremely difficult and he does very well on some nights but not so much on others.  Had a recent admission to the hospital for COPD exacerbation and pneumonia where he was admitted for 5 days before being discharged home.  He is now being followed by pulmonology.  Continues to smoke cigarettes but notes that during his hospitalization, he did not feel the need for cigarette and was okay without smoking.  Continues to monitor his blood pressure periodically but not as stringently as before as he does seem to be on a stable regimen now.  Requesting a refill on Cardura that has previously been prescribed our office.   Objective:    Vitals:   01/03/22 1545  BP: 106/66  Pulse: 83  Resp: 20  Height: 6' (1.829 m)  Weight: 188 lb (85.3 kg)  SpO2: 95%  BMI (Calculated): 25.49     Physical Exam Vitals reviewed.  Constitutional:      General: He is not in acute distress.    Appearance: Normal appearance. He is not ill-appearing.  HENT:     Head: Normocephalic.  Cardiovascular:     Rate and Rhythm: Normal rate.     Pulses: Normal pulses.     Heart sounds: Normal heart sounds. No murmur heard.    No friction rub. No gallop.  Pulmonary:     Effort: Pulmonary effort is normal. No respiratory distress.     Breath sounds: Normal breath sounds.  Skin:    General: Skin is warm and dry.  Neurological:     Mental Status: He is alert and  oriented to person, place, and time.  Psychiatric:        Mood and Affect: Mood normal.        Behavior: Behavior normal.        Thought Content: Thought content normal.        Judgment: Judgment normal.   No results found for this or any previous visit (from the past 24 hour(s)).     The 10-year ASCVD risk score (Arnett DK, et al., 2019) is: 15.1%   Values used to calculate the score:     Age: 71 years     Sex: Male     Is Non-Hispanic African American: No     Diabetic: No     Tobacco smoker: Yes     Systolic Blood Pressure: 106 mmHg     Is BP treated: Yes     HDL Cholesterol: 76 mg/dL     Total Cholesterol: 167 mg/dL   Assessment & Plan:   1. Essential hypertension Managed by cardiology. Continue current medications. BP looks great today. Refilling Cardura per patient request.   2. OSA on CPAP Encouraged continued CPAP use.   3. COPD with acute exacerbation (HCC) Resolved exacerbation. Recommend smoking cessation. Patient aware of prescription aids to quit. Not currently interested in starting anything right now.  4. Tobacco abuse See above.   5. Hyperlipidemia, unspecified hyperlipidemia type Checking labs today. Please forward results to cardiology for their review and upcoming appointment.  - Lipid panel - COMPLETE METABOLIC PANEL WITH GFR  Return in about 6 months (around 07/06/2022) for chronic disease follow up. ___________________________________________ Thayer Ohm, DNP, APRN, FNP-BC Primary Care and Sports Medicine [facility]

## 2022-01-05 DIAGNOSIS — E785 Hyperlipidemia, unspecified: Secondary | ICD-10-CM | POA: Diagnosis not present

## 2022-01-06 ENCOUNTER — Encounter (HOSPITAL_BASED_OUTPATIENT_CLINIC_OR_DEPARTMENT_OTHER): Payer: Self-pay | Admitting: Cardiovascular Disease

## 2022-01-06 LAB — COMPLETE METABOLIC PANEL WITH GFR
AG Ratio: 1.8 (calc) (ref 1.0–2.5)
ALT: 16 U/L (ref 9–46)
AST: 16 U/L (ref 10–35)
Albumin: 4.1 g/dL (ref 3.6–5.1)
Alkaline phosphatase (APISO): 75 U/L (ref 35–144)
BUN: 9 mg/dL (ref 7–25)
CO2: 28 mmol/L (ref 20–32)
Calcium: 9 mg/dL (ref 8.6–10.3)
Chloride: 100 mmol/L (ref 98–110)
Creat: 0.86 mg/dL (ref 0.70–1.28)
Globulin: 2.3 g/dL (calc) (ref 1.9–3.7)
Glucose, Bld: 102 mg/dL — ABNORMAL HIGH (ref 65–99)
Potassium: 4.7 mmol/L (ref 3.5–5.3)
Sodium: 136 mmol/L (ref 135–146)
Total Bilirubin: 0.8 mg/dL (ref 0.2–1.2)
Total Protein: 6.4 g/dL (ref 6.1–8.1)
eGFR: 93 mL/min/{1.73_m2} (ref >=60)

## 2022-01-06 LAB — LIPID PANEL
Cholesterol: 181 mg/dL (ref <200)
HDL: 86 mg/dL (ref >=40)
LDL Cholesterol (Calc): 82 mg/dL (calc)
Non-HDL Cholesterol (Calc): 95 mg/dL (calc) (ref <130)
Total CHOL/HDL Ratio: 2.1 (calc) (ref <5.0)
Triglycerides: 47 mg/dL (ref <150)

## 2022-01-06 NOTE — Telephone Encounter (Signed)
FYI to look for results per patient.

## 2022-01-11 ENCOUNTER — Ambulatory Visit (INDEPENDENT_AMBULATORY_CARE_PROVIDER_SITE_OTHER): Payer: Medicare Other | Admitting: Family

## 2022-01-11 ENCOUNTER — Encounter (HOSPITAL_BASED_OUTPATIENT_CLINIC_OR_DEPARTMENT_OTHER): Payer: Self-pay | Admitting: Family

## 2022-01-11 VITALS — BP 112/78 | HR 73 | Ht 72.0 in | Wt 189.0 lb

## 2022-01-11 DIAGNOSIS — E7800 Pure hypercholesterolemia, unspecified: Secondary | ICD-10-CM | POA: Diagnosis not present

## 2022-01-11 DIAGNOSIS — I10 Essential (primary) hypertension: Secondary | ICD-10-CM

## 2022-01-11 DIAGNOSIS — I25118 Atherosclerotic heart disease of native coronary artery with other forms of angina pectoris: Secondary | ICD-10-CM

## 2022-01-11 MED ORDER — ATORVASTATIN CALCIUM 20 MG PO TABS: 20.0000 mg | ORAL_TABLET | Freq: Every day | ORAL | 3 refills | Status: DC

## 2022-01-11 NOTE — Progress Notes (Signed)
Office Visit    Patient Name: Jason Carson Date of Encounter: 01/14/2022  PCP:  Christen Butter, NP   [facility]  Cardiologist:  Chilton Si, MD  Advanced Practice Provider:  No care team member to display Electrophysiologist:  None      Chief Complaint    Jason Carson is a 71 y.o. male presents today for hospital follow up.    Past Medical History    Past Medical History:  Diagnosis Date   Atherosclerosis    Benign essential tremor    CAD in native artery 08/17/2021   Lower extremity edema 12/05/2021   Pure hypercholesterolemia 08/17/2021   RBBB 04/12/2021   Tobacco abuse 04/12/2021   Past Surgical History:  Procedure Laterality Date   ABDOMINAL AORTOGRAM W/LOWER EXTREMITY N/A 01/28/2020   Procedure: ABDOMINAL AORTOGRAM W/LOWER EXTREMITY;  Surgeon: Cephus Shelling, MD;  Location: [facility];  Service: Cardiovascular;  Laterality: N/A;   PERIPHERAL VASCULAR INTERVENTION Bilateral 01/28/2020   Procedure: PERIPHERAL VASCULAR INTERVENTION;  Surgeon: Cephus Shelling, MD;  Location: [facility];  Service: Cardiovascular;  Laterality: Bilateral;  external iliac stents    Allergies  No Known Allergies  History of Present Illness    Jason Carson is a 71 y.o. male with a hx of RBBB, CAD, PAD (stenting of R and L external iliac 01/2020),HTN, HLD, COPD, ISA ( not consistently on CPAP) tobacco use last seen 12/05/21 by Dr. Duke Salvia.  Established with [facility] HTN clinic 03/2021. Amlodipine increased and moved to evening to help with elevated morning BP. 05/2021 Valsartan added and later swtiched to evening. Due to exertional dyspnea nuclear stress test 07/2021 revealed Ef 71%, no ischemia. 09/2021 HCTZ added. At last video visit 12/05/21 echocardiogram ordered due to LE edema.   Admitted 7/12-7/17/23 PNA treated with IV abx, COPD exacerbation treated with steroids. Due to hyponatremia HCTZ discontinued.   12/21/21 echocardiogram EF 60-65%,  no RWMA, mild LVH, gr1DD, RV mildly enlarged, normal PASP, mild to moderate TR.   Presents today for follow up. BP has been well controlled since discharge. Mild swelling in bilateral feet which is overall not bothersome. We reviewed echo in detail. No worsening dyspnea, orthopnea, PND, chest pain.   EKGs/Labs/Other Studies Reviewed:   The following studies were reviewed today: ABI  11/03/2021: Summary:  Right: Resting right ankle-brachial index indicates moderate right lower  extremity arterial disease. The right toe-brachial index is abnormal.   Left: Resting left ankle-brachial index is within normal range. No  evidence of significant left lower extremity arterial disease. The left  toe-brachial index is normal.    Abdominal Aorta Study  11/03/2021: Summary:  Stenosis:  Bilateral external iliac artery stents appear patent with no evidence of  stenosis.    Lexiscan Myoview 08/24/2021:   Findings are consistent with no prior ischemia. The study is low risk.   No ST deviation was noted.   Left ventricular function is normal. Nuclear stress EF: 71 %. The left ventricular ejection fraction is hyperdynamic (>65%). End diastolic cavity size is normal. End systolic cavity size is normal.   Prior study not available for comparison.   Patient was unable to achieve THR on the treadmill, only reaching 76% of MPHR. Study converted to lexiscan. Perfusion appears normal. Overall low risk study. LVEF 71%. Evaluate for possible chronotropic incompetence given RBBB since he did attempt 10 minutes of exercise, but could not achieve target heart rate. No prior for comparison   CT Chest 01/19/21 -  Lung-RADS 2, benign appearance or behavior. Continue annual screening with low-dose chest CT without contrast in 12 months. -Three-vessel coronary artery calcifications, aortic Atherosclerosis (ICD10-I70.0) and Emphysema (ICD10-J43.9).   LE Doppler 11/02/20 Right: Resting right ankle-brachial index indicates  moderate right lower  extremity arterial disease. The right toe-brachial index is abnormal. RT  great toe pressure = 113 mmHg.  Left: Resting left ankle-brachial index is within normal range. No  evidence of significant left lower extremity arterial disease. The left  toe-brachial index is normal. LT Great toe pressure = 175 mmHg.    Abdominal Aortogram 01/28/2020: Aortogram showed one right and three left renal arteries that were widely patent.  The infrarenal aorta was widely patent.  On the right he had a patent common iliac and hypogastric artery but a total occlusion of the right external iliac with reconstitution of the common femoral and then a flush right SFA occlusion and only profunda runoff on the right.  On the left he had a patent common iliac artery and a high-grade 80% calcified stenosis in the left external iliac that we initially had some difficulty crossing retrograde for sheath access.   After evaluating the images then accessed the right common femoral artery retrograde and was able to cross the right external iliac occlusion.  This was primarily stented with an 8 mm Innova postdilated with a 7 mm Mustang.  He now has a right femoral pulse. He also endorses claudication symptoms in the left lower extremity and given access issues with the left external iliac lesion I stentede this with a 8 mm x 40 mm self-expanding Innova and post-dilated this with a 7 mm angioplasty balloon.  The stent was too short and had to place a second 8 mm x 20 mm self expanding Innova.   Bilateral lower extremity runoff after iliac stent placement on the right shows flush SFA occlusion with reconstitution of the distal SFA patent above and below-knee popliteal artery and apparent three-vessel runoff.  On the left he has a patent SFA with some calcific disease but no flow-limiting stenosis as well as a patent above below-knee popliteal artery and two-vessel runoff in the peroneal and posterior tibial.  EKG:   No EKG today.   Recent Labs: 12/07/2021: B Natriuretic Peptide 50.2 12/10/2021: Hemoglobin 12.8; Magnesium 2.2; Platelets 242 01/05/2022: ALT 16; BUN 9; Creat 0.86; Potassium 4.7; Sodium 136  Recent Lipid Panel    Component Value Date/Time   CHOL 181 01/05/2022 0000   TRIG 47 01/05/2022 0000   HDL 86 01/05/2022 0000   CHOLHDL 2.1 01/05/2022 0000   LDLCALC 82 01/05/2022 0000   Home Medications   Current Meds  Medication Sig   albuterol (VENTOLIN HFA) 108 (90 Base) MCG/ACT inhaler Inhale 2 puffs into the lungs every 4 (four) hours as needed for wheezing or shortness of breath.   AMBULATORY NON FORMULARY MEDICATION Continuous positive airway pressure (CPAP) machine set on AutoPAP (4-20 cmH2O), with all supplemental supplies as needed.   amLODipine (NORVASC) 5 MG tablet Take 1 tablet (5 mg total) by mouth in the morning and at bedtime.   Aspirin 81 MG CAPS Take 81 mg by mouth daily.   atorvastatin (LIPITOR) 20 MG tablet Take 1 tablet (20 mg total) by mouth daily.   chlorhexidine (PERIDEX) 0.12 % solution 2 (two) times daily.   clopidogrel (PLAVIX) 75 MG tablet TAKE 1 TABLET BY MOUTH EVERY DAY (Patient taking differently: Take 75 mg by mouth daily.)   doxazosin (CARDURA) 8 MG tablet Take  1 tablet (8 mg total) by mouth at bedtime.   Multiple Vitamin (MULTIVITAMIN WITH MINERALS) TABS tablet Take 1 tablet by mouth daily.   valsartan (DIOVAN) 80 MG tablet TAKE 1 TABLET BY MOUTH EVERY DAY   [DISCONTINUED] atorvastatin (LIPITOR) 10 MG tablet TAKE 1 TABLET BY MOUTH EVERY DAY (Patient taking differently: Take 10 mg by mouth daily.)     Review of Systems      All other systems reviewed and are otherwise negative except as noted above.  Physical Exam    VS:  BP 112/78   Pulse 73   Ht 6' (1.829 m)   Wt 189 lb (85.7 kg)   BMI 25.63 kg/m  , BMI Body mass index is 25.63 kg/m.  Wt Readings from Last 3 Encounters:  01/11/22 189 lb (85.7 kg)  01/03/22 188 lb (85.3 kg)  01/02/22 185 lb 9.6 oz  (84.2 kg)    GEN: Well nourished, well developed, in no acute distress. HEENT: normal. Neck: Supple, no JVD, carotid bruits, or masses. Cardiac: RRR, no murmurs, rubs, or gallops. No clubbing, cyanosis, edema.  Radials/PT 2+ and equal bilaterally.  Respiratory:  Respirations regular and unlabored, clear to auscultation bilaterally. GI: Soft, nontender, nondistended. MS: No deformity or atrophy. Skin: Warm and dry, no rash. Neuro:  Strength and sensation are intact. Psych: Normal affect.  Assessment & Plan    HTN - BP well controlled. Continue current antihypertensive regimen.  BP has remained controlled since HCTZ discontinued due to hypernatremia.  CAD / HLD, LDL goal <70 - Stable with no anginal symptoms. No indication for ischemic evaluation.  GDMT aspirin, atorvastain, plavix. Heart healthy diet and regular cardiovascular exercise encouraged.  Most recent LDL not at goal <70 - increase Atorvastatin to 20mg  daily with LFT/FLP in 6-8 weeks.   LE edema - Echo 11/2021 normal LVEF, mild LVH, gr1DD, mild to moderate TR. HCTZ discontinued during 11/2021 admission due to hyponatremia. Mild bilateral pedal edema worse at end of day likely due to venous insufficiency which is not bothersome to him. Will defer additional medical therapy. Recommend elevation, low salt diet, compression stockings.       Disposition: Follow up  3-4 months  with Skeet Latch, MD or APP.  Signed, Loel Dubonnet, NP 01/14/2022, 3:03 PM [place]

## 2022-01-11 NOTE — Telephone Encounter (Signed)
Dsicussed with patient in clinic 8/16 at appointment

## 2022-01-11 NOTE — Patient Instructions (Addendum)
Medication Instructions:  Your physician has recommended you make the following change in your medication:   INCREASE Atorvastatin to 20mg  daily  *If you need a refill on your cardiac medications before your next appointment, please call your pharmacy*   Lab Work: Your physician recommends that you return for lab work in 2 months for fasting lipid panel and CMP  Please return for Lab work. You may come to the...   [facility] (3rd floor) [address] [place], [place]  Open: 8am-Noon and 1pm-4:30pm  Please ring the doorbell on the small table when you exit the elevator and the Lab Tech will come get you  [facility] at [facility] [address], [place], [place] Open: 8am-1pm, then 2pm-4:30pm   [facility]- Please see attached locations sheet stapled to your lab work with address and hours.    If you have labs (blood work) drawn today and your tests are completely normal, you will receive your results only by: MyChart Message (if you have MyChart) OR A paper copy in the mail If you have any lab test that is abnormal or we need to change your treatment, we will call you to review the results.   Testing/Procedures: None ordered today.  Your echocardiogram showed normal heart pumping function, heart muscle mildly stiff, tricuspid valve mild to moderately leaky. Prevent this from progressing by keeping BP well controlled.    Follow-Up: At [facility], you and your health needs are our priority.  As part of our continuing mission to provide you with exceptional heart care, we have created designated Provider Care Teams.  These Care Teams include your primary Cardiologist (physician) and Advanced Practice Providers (APPs -  Physician Assistants and Nurse Practitioners) who all work together to provide you with the care you need, when you need it.  We recommend signing up for the patient portal called "MyChart".  Sign  up information is provided on this After Visit Summary.  MyChart is used to connect with patients for Virtual Visits (Telemedicine).  Patients are able to view lab/test results, encounter notes, upcoming appointments, etc.  Non-urgent messages can be sent to your provider as well.   To learn more about what you can do with MyChart, go to [facility].    Your next appointment:   3-4 month(s)  The format for your next appointment:   In Person or Virtual  Provider:   [email or web address], MD or Chilton Si, NP     Other Instructions  To prevent or reduce lower extremity swelling: Eat a low salt diet. Salt makes the body hold onto extra fluid which causes swelling. Sit with legs elevated. For example, in the recliner or on an ottoman.  Wear knee-high compression stockings during the daytime. Ones labeled 15-20 mmHg provide good compression.   Heart Healthy Diet Recommendations: A low-salt diet is recommended. Meats should be grilled, baked, or boiled. Avoid fried foods. Focus on lean protein sources like fish or chicken with vegetables and fruits. The American Heart Association is a [place]!  American Heart Association Diet and Lifeystyle Recommendations   Exercise recommendations: The American Heart Association recommends 150 minutes of moderate intensity exercise weekly. Try 30 minutes of moderate intensity exercise 4-5 times per week. This could include walking, jogging, or swimming.   Important Information About [place]

## 2022-01-14 ENCOUNTER — Encounter (HOSPITAL_BASED_OUTPATIENT_CLINIC_OR_DEPARTMENT_OTHER): Payer: Self-pay | Admitting: Family

## 2022-01-18 ENCOUNTER — Encounter: Payer: Self-pay | Admitting: General Practice

## 2022-01-19 ENCOUNTER — Ambulatory Visit: Payer: Medicare Other

## 2022-01-19 DIAGNOSIS — F17210 Nicotine dependence, cigarettes, uncomplicated: Secondary | ICD-10-CM

## 2022-01-19 DIAGNOSIS — Z87891 Personal history of nicotine dependence: Secondary | ICD-10-CM

## 2022-01-23 ENCOUNTER — Other Ambulatory Visit: Payer: Self-pay | Admitting: Acute Care

## 2022-01-23 DIAGNOSIS — Z87891 Personal history of nicotine dependence: Secondary | ICD-10-CM

## 2022-01-23 DIAGNOSIS — Z122 Encounter for screening for malignant neoplasm of respiratory organs: Secondary | ICD-10-CM

## 2022-01-23 DIAGNOSIS — F17210 Nicotine dependence, cigarettes, uncomplicated: Secondary | ICD-10-CM

## 2022-02-08 ENCOUNTER — Ambulatory Visit (INDEPENDENT_AMBULATORY_CARE_PROVIDER_SITE_OTHER): Payer: Medicare Other | Admitting: Internal Medicine

## 2022-02-08 DIAGNOSIS — J4489 Other specified chronic obstructive pulmonary disease: Secondary | ICD-10-CM

## 2022-02-08 DIAGNOSIS — F17200 Nicotine dependence, unspecified, uncomplicated: Secondary | ICD-10-CM

## 2022-02-08 DIAGNOSIS — J449 Chronic obstructive pulmonary disease, unspecified: Secondary | ICD-10-CM | POA: Diagnosis not present

## 2022-02-08 NOTE — Patient Instructions (Signed)
Full PFT performed today. °

## 2022-02-08 NOTE — Progress Notes (Signed)
Full PFT performed today. °

## 2022-02-09 LAB — PULMONARY FUNCTION TEST
DL/VA % pred: 80 %
DL/VA: 3.22 ml/min/mmHg/L
DLCO cor % pred: 69 %
DLCO cor: 18.99 ml/min/mmHg
DLCO unc % pred: 69 %
DLCO unc: 18.99 ml/min/mmHg
FEF 25-75 Post: 1.21 L/sec
FEF 25-75 Pre: 0.91 L/sec
FEF2575-%Change-Post: 32 %
FEF2575-%Pred-Post: 46 %
FEF2575-%Pred-Pre: 34 %
FEV1-%Change-Post: 7 %
FEV1-%Pred-Post: 52 %
FEV1-%Pred-Pre: 48 %
FEV1-Post: 1.82 L
FEV1-Pre: 1.7 L
FEV1FVC-%Change-Post: -4 %
FEV1FVC-%Pred-Pre: 84 %
FEV6-%Change-Post: 10 %
FEV6-%Pred-Post: 67 %
FEV6-%Pred-Pre: 61 %
FEV6-Post: 3 L
FEV6-Pre: 2.73 L
FEV6FVC-%Change-Post: -2 %
FEV6FVC-%Pred-Post: 102 %
FEV6FVC-%Pred-Pre: 105 %
FVC-%Change-Post: 13 %
FVC-%Pred-Post: 65 %
FVC-%Pred-Pre: 58 %
FVC-Post: 3.11 L
FVC-Pre: 2.75 L
Post FEV1/FVC ratio: 59 %
Post FEV6/FVC ratio: 97 %
Pre FEV1/FVC ratio: 62 %
Pre FEV6/FVC Ratio: 99 %
RV % pred: 130 %
RV: 3.37 L
TLC % pred: 86 %
TLC: 6.42 L

## 2022-02-13 ENCOUNTER — Encounter: Payer: Self-pay | Admitting: Internal Medicine

## 2022-02-13 ENCOUNTER — Ambulatory Visit (INDEPENDENT_AMBULATORY_CARE_PROVIDER_SITE_OTHER): Payer: Medicare Other | Admitting: Internal Medicine

## 2022-02-13 VITALS — BP 132/74 | HR 88 | Temp 98.1°F | Ht 72.0 in | Wt 187.6 lb

## 2022-02-13 DIAGNOSIS — Z122 Encounter for screening for malignant neoplasm of respiratory organs: Secondary | ICD-10-CM | POA: Diagnosis not present

## 2022-02-13 DIAGNOSIS — Z9989 Dependence on other enabling machines and devices: Secondary | ICD-10-CM

## 2022-02-13 DIAGNOSIS — J449 Chronic obstructive pulmonary disease, unspecified: Secondary | ICD-10-CM

## 2022-02-13 DIAGNOSIS — F17200 Nicotine dependence, unspecified, uncomplicated: Secondary | ICD-10-CM | POA: Diagnosis not present

## 2022-02-13 DIAGNOSIS — G4733 Obstructive sleep apnea (adult) (pediatric): Secondary | ICD-10-CM

## 2022-02-13 DIAGNOSIS — J4489 Other specified chronic obstructive pulmonary disease: Secondary | ICD-10-CM

## 2022-02-13 NOTE — Progress Notes (Signed)
Jason Carson    [id number]    24-Aug-1950  Primary Care Physician:Jessup, [name], NP Date of Appointment: 02/13/2022 Established Patient Visit  Chief complaint:   Chief Complaint  Patient presents with   Follow-up    PFT review SOB unchanged       HPI: Jason Carson is a 71 y.o. man with history of PVD s/p iliac stenting, tobacco use disorder.   Interval Updates: Here for follow up after pfts. Has been using albuterol 2-3 times/week with improvement in his symptoms. Continues to smoke. No change in his breathing or symptoms.    I have reviewed the patient's family social and past medical history and updated as appropriate.   Past Medical History:  Diagnosis Date   Atherosclerosis    Benign essential tremor    CAD in native artery 08/17/2021   Lower extremity edema 12/05/2021   Pure hypercholesterolemia 08/17/2021   RBBB 04/12/2021   Tobacco abuse 04/12/2021    Past Surgical History:  Procedure Laterality Date   ABDOMINAL AORTOGRAM W/LOWER EXTREMITY N/A 01/28/2020   Procedure: ABDOMINAL AORTOGRAM W/LOWER EXTREMITY;  Surgeon: Cephus Shelling, MD;  [facility];  Service: Cardiovascular;  Laterality: N/A;   PERIPHERAL VASCULAR INTERVENTION Bilateral 01/28/2020   Procedure: PERIPHERAL VASCULAR INTERVENTION;  Surgeon: Cephus Shelling, MD;  [facility]: [facility];  Service: Cardiovascular;  Laterality: Bilateral;  external iliac stents    Family History  Problem Relation Age of Onset   Hypertension Mother    Hyperlipidemia Mother    Hypertension Father     Social History   Occupational History    Comment: Retired.  Tobacco Use   Smoking status: Every Day    Packs/day: 1.00    Years: 30.00    Total pack years: 30.00    Types: Cigarettes   Smokeless tobacco: Never   Tobacco comments:    1 pack a day. ARJ 02/13/22  Vaping Use   Vaping Use: Never used  Substance and Sexual Activity   Alcohol use: Yes    Alcohol/week: 8.0  - 10.0 standard drinks of alcohol    Types: 8 - 10 Standard drinks or equivalent per week   Drug use: Never   Sexual activity: Yes     Physical Exam: Blood pressure 132/74, pulse 88, temperature 98.1 F (36.7 C), temperature source Oral, height 6' (1.829 m), weight 187 lb 9.6 oz (85.1 kg), SpO2 97 %.  Gen:      No acute distress ENT:  no nasal polyps, mucus membranes moist Lungs:    No increased respiratory effort, symmetric chest wall excursion, clear to auscultation bilaterally, no wheezes or crackles CV:         Regular rate and rhythm; no murmurs, rubs, or gallops.  No pedal edema   Data Reviewed: Imaging: I have personally reviewed the CT Chest August 2023 - paraseptal and centrilobular emphysema. No nodules or masses suspicious for primary lung malignancy,   PFTs:     Latest Ref Rng & Units 02/08/2022    3:48 PM  PFT Results  FVC-Pre L 2.75  P  FVC-Predicted Pre % 58  P  FVC-Post L 3.11  P  FVC-Predicted Post % 65  P  Pre FEV1/FVC % % 62  P  Post FEV1/FCV % % 59  P  FEV1-Pre L 1.70  P  FEV1-Predicted Pre % 48  P  FEV1-Post L 1.82  P  DLCO uncorrected ml/min/mmHg 18.99  P  DLCO UNC% % 69  P  DLCO corrected ml/min/mmHg 18.99  P  DLCO COR %Predicted % 69  P  DLVA Predicted % 80  P  TLC L 6.42  P  TLC % Predicted % 86  P  RV % Predicted % 130  P    P Preliminary result   I have personally reviewed the patient's PFTs and Severe airflow limitation with significant response to bronchodilator.   Labs:  Immunization status: Immunization History  Administered Date(s) Administered   Fluad Quad(high Dose 65+) 02/09/2020   Alphonsa Overall (J&J) SARS-COV-2 Vaccination 09/05/2019    External Records Personally Reviewed:   Assessment:  Severe COPD FEV1 48% of predicted Ongoing tobacco use disorder OSA on CPAP  Plan/Recommendations: Continue CPAP therapy.  We discussed adding a maintenance inhaler for COPD today. At this point he would rather hold off but will contact  us if he changes his mind. He would like to continue cutting back on smoking on his own for now.  Need for LDCT in August 2024 for screening.   Smoking Cessation Counseling:  1. The patient is an everyday smoker and symptomatic due to the following condition COPD 2. The patient is currently contemplativein quitting smoking. 3. I advised patient to quit smoking. 4. We identified patient specific barriers to change.  5. I personally spent 4 minutes counseling the patient regarding tobacco use disorder. 6. We discussed management of stress and anxiety to help with smoking cessation, when applicable. 7. We discussed nicotine replacement therapy, Wellbutrin, Chantix as possible options. 8. I advised setting a quit date. 9. Follow?up arranged with our office to continue ongoing discussions. 10.Resources given to patient including quit hotline.    Return to Care: Return in about 6 months (around 08/14/2022).   Lenice Llamas, MD Pulmonary and [place]

## 2022-02-13 NOTE — Patient Instructions (Addendum)
Please schedule follow up scheduled with myself in 6 months.  If my schedule is not open yet, we will contact you with a reminder closer to that time. Please call [phone] if you haven't heard from [place] a month before.   Continue CPAP therapy.  We discussed adding a maintenance inhaler for COPD today. Let me know if this is something you want to try.   What are the benefits of quitting smoking? Quitting smoking can lower your chances of getting or dying from heart disease, lung disease, kidney failure, infection, or cancer. It can also lower your chances of getting osteoporosis, a condition that makes your bones weak. Plus, quitting smoking can help your skin look younger and reduce the chances that you will have problems with sex.  Quitting smoking will improve your health no matter how old you are, and no matter how long or how much you have smoked.  What should I do if I want to quit smoking? The letters in the word "START" can help you remember the steps to take: S = Set a quit date. T = Tell family, friends, and the people around you that you plan to quit. A = Anticipate or plan ahead for the tough times you'll face while quitting. R = Remove cigarettes and other tobacco products from your home, car, and work. T = Talk to your doctor about getting help to quit.  How can my doctor or nurse help? Your doctor or nurse can give you advice on the best way to quit. He or she can also put you in touch with counselors or other people you can call for support. Plus, your doctor or nurse can give you medicines to: ?Reduce your craving for cigarettes ?Reduce the unpleasant symptoms that happen when you stop smoking (called "withdrawal symptoms"). You can also get help from a free phone line (1-800-QUIT-NOW) or go online to [employer].  What are the symptoms of withdrawal? The symptoms include: ?Trouble sleeping ?Being irritable, anxious or restless ?Getting frustrated or  angry ?Having trouble thinking clearly  Some people who stop smoking become temporarily depressed. Some people need treatment for depression, such as counseling or antidepressant medicines. Depressed people might: ?No longer enjoy or care about doing the things they used to like to do ?Feel sad, down, hopeless, nervous, or cranky most of the day, almost every day ?Lose or gain weight ?Sleep too much or too little ?Feel tired or like they have no energy ?Feel guilty or like they are worth nothing ?Forget things or feel confused ?Move and speak more slowly than usual ?Act restless or have trouble staying still ?Think about death or suicide  If you think you might be depressed, see your doctor or nurse. Only someone trained in mental health can tell for sure if you are depressed. If you ever feel like you might hurt yourself, go straight to the nearest emergency department. Or you can call for an ambulance (in the [place] and [place], [place] 9-1-1) or call your doctor or nurse right away and tell them it is an emergency. You can also reach the [place] National Suicide Prevention Lifeline at 856-503-1313 or http://walker-sanchez.info/.  How do medicines help you stop smoking? Different medicines work in different ways: ?Nicotine replacement therapy eases withdrawal and reduces your body's craving for nicotine, the main drug found in cigarettes. There are different forms of nicotine replacement, including skin patches, lozenges, gum, nasal sprays, and "puffers" or inhalers. Many can be bought without a prescription, while  others might require one. ?Bupropion is a prescription medicine that reduces your desire to smoke. This medicine is sold under the brand names Zyban and Wellbutrin. It is also available in a generic version, which is cheaper than brand name medicines. ?Varenicline (brand names: Chantix, Champix) is a prescription medicine that reduces withdrawal symptoms and cigarette cravings. If you  think you'd like to take varenicline and you have a history of depression, anxiety, or heart disease, discuss this with your doctor or nurse before taking the medicine. Varenicline can also increase the effects of alcohol in some people. It's a good idea to limit drinking while you're taking it, at least until you know how it affects you.  How does counseling work? Counseling can happen during formal office visits or just over the phone. A counselor can help you: ?Figure out what triggers your smoking and what to do instead ?Overcome cravings ?Figure out what went wrong when you tried to quit before  What works best? Studies show that people have the best luck at quitting if they take medicines to help them quit and work with a Veterinary surgeon. It might also be helpful to combine nicotine replacement with one of the prescription medicines that help people quit. In some cases, it might even make sense to take bupropion and varenicline together.  What about e-cigarettes? Sometimes people wonder if using electronic cigarettes, or "e-cigarettes," might help them quit smoking. Using e-cigarettes is also called "vaping." Doctors do not recommend e-cigarettes in place of medicines and counseling. That's because e-cigarettes still contain nicotine as well as other substances that might be harmful. It's not clear how they can affect a person's health in the long term.  Will I gain weight if I quit? Yes, you might gain a few pounds. But quitting smoking will have a much more positive effect on your health than weighing a few pounds more. Plus, you can help prevent some weight gain by being more active and eating less. Taking the medicine bupropion might help control weight gain.   What else can I do to improve my chances of quitting? You can: ?Start exercising. ?Stay away from smokers and places that you associate with smoking. If people close to you smoke, ask them to quit with you. ?Keep gum, hard candy, or  something to put in your mouth handy. If you get a craving for a cigarette, try one of these instead. ?Don't give up, even if you start smoking again. It takes most people a few tries before they succeed.  What if I am pregnant and I smoke? If you are pregnant, it's really important for the health of your baby that you quit. Ask your doctor what options you have, and what is safest for your baby

## 2022-03-22 ENCOUNTER — Other Ambulatory Visit (HOSPITAL_BASED_OUTPATIENT_CLINIC_OR_DEPARTMENT_OTHER): Payer: Self-pay | Admitting: Family

## 2022-03-22 DIAGNOSIS — E7800 Pure hypercholesterolemia, unspecified: Secondary | ICD-10-CM | POA: Diagnosis not present

## 2022-03-22 DIAGNOSIS — I25118 Atherosclerotic heart disease of native coronary artery with other forms of angina pectoris: Secondary | ICD-10-CM | POA: Diagnosis not present

## 2022-03-23 LAB — COMPREHENSIVE METABOLIC PANEL
AG Ratio: 1.9 (calc) (ref 1.0–2.5)
ALT: 19 U/L (ref 9–46)
AST: 19 U/L (ref 10–35)
Albumin: 4.3 g/dL (ref 3.6–5.1)
Alkaline phosphatase (APISO): 74 U/L (ref 35–144)
BUN: 10 mg/dL (ref 7–25)
CO2: 27 mmol/L (ref 20–32)
Calcium: 9.3 mg/dL (ref 8.6–10.3)
Chloride: 99 mmol/L (ref 98–110)
Creat: 0.91 mg/dL (ref 0.70–1.28)
Globulin: 2.3 g/dL (calc) (ref 1.9–3.7)
Glucose, Bld: 89 mg/dL (ref 65–99)
Potassium: 4.7 mmol/L (ref 3.5–5.3)
Sodium: 134 mmol/L — ABNORMAL LOW (ref 135–146)
Total Bilirubin: 0.5 mg/dL (ref 0.2–1.2)
Total Protein: 6.6 g/dL (ref 6.1–8.1)

## 2022-03-23 LAB — LIPID PANEL
Cholesterol: 161 mg/dL (ref <200)
HDL: 87 mg/dL (ref >=40)
LDL Cholesterol (Calc): 61 mg/dL (calc)
Non-HDL Cholesterol (Calc): 74 mg/dL (calc) (ref <130)
Total CHOL/HDL Ratio: 1.9 (calc) (ref <5.0)
Triglycerides: 57 mg/dL (ref <150)

## 2022-04-21 ENCOUNTER — Other Ambulatory Visit: Payer: Self-pay | Admitting: Medical-Surgical

## 2022-05-08 ENCOUNTER — Encounter (HOSPITAL_BASED_OUTPATIENT_CLINIC_OR_DEPARTMENT_OTHER): Payer: Self-pay | Admitting: Cardiovascular Disease

## 2022-05-08 ENCOUNTER — Telehealth (INDEPENDENT_AMBULATORY_CARE_PROVIDER_SITE_OTHER): Payer: Medicare Other | Admitting: Cardiovascular Disease

## 2022-05-08 VITALS — BP 136/79 | HR 72 | Ht 72.0 in | Wt 190.0 lb

## 2022-05-08 DIAGNOSIS — I70229 Atherosclerosis of native arteries of extremities with rest pain, unspecified extremity: Secondary | ICD-10-CM | POA: Diagnosis not present

## 2022-05-08 DIAGNOSIS — Z720 Tobacco use: Secondary | ICD-10-CM | POA: Diagnosis not present

## 2022-05-08 DIAGNOSIS — E7800 Pure hypercholesterolemia, unspecified: Secondary | ICD-10-CM | POA: Diagnosis not present

## 2022-05-08 DIAGNOSIS — I2510 Atherosclerotic heart disease of native coronary artery without angina pectoris: Secondary | ICD-10-CM

## 2022-05-08 DIAGNOSIS — I10 Essential (primary) hypertension: Secondary | ICD-10-CM

## 2022-05-08 NOTE — Progress Notes (Addendum)
Advanced Hypertension Clinic Follow-up- Video Visit:    Date:  05/08/2022   ID:  Jason Carson, DOB 1950/06/23, MRN [id number]  PCP:  Samuel Bouche, NP  Cardiologist:  Skeet Latch, MD   Because of Gardner Candle co-morbid illnesses, he is at least at moderate risk for complications without adequate follow up.  This format is felt to be most appropriate for this patient at this time.  All issues noted in this document were discussed and addressed.  A limited physical exam was performed with this format.  Please refer to the patient's chart for his consent to telehealth for [facility].  The patient was identified using 2 identifiers.  Patient Location: Home Provider Location: Office/Clinic  Video visit- not converted to audio   Evaluation Performed:  Follow-Up Visit  Referring MD: Samuel Bouche, NP   CC: Hypertension  History of Present Illness:    Jason Carson is a 71 y.o. male with a hx of hypertension, critical limb ischemia, tobacco abuse, and sleep apnea (not consistently on CPAP) here for follow-up. He was initially seen 04/12/2021 to establish care in the Advanced Hypertension Clinic. He last saw Samuel Bouche, NP on 02/09/21 for hypertension. He checked his blood pressure 2-3 times daily and noticed the readings were higher in the morning and normal or low later in the day. He continued to take amlodipine 2.5mg  daily and Cardura 8mg  nightly.  He was referred to the hypertension clinic for evaluation of potential causes to the morning surge in his blood pressure. He sees vascular surgery for PAD. He had stenting of the R and L external iliacs on 01/2020 and has no claudication.   At his initial visit he reported frequent nocturia. Amlodipine was increased and switched to the evening to help with his elevated morning blood pressures. He was enrolled in our remote patient monitoring study. He followed up with our pharmacist and his blood pressures continued to be high in the  mornings but were controlled in the evenings. It was noted that his home blood pressure cuff was reading higher than the in-office blood pressure cuff.  They decided to increase amlodipine to 10 mg. On 05/2021 valsartan was added. He followed up 06/2021 and his BP was average was at goal at home, though he did have some hypotensive episodes. They recommended switching his nicotine gum to lozenges given his dental devices.  At his last in office visit his blood pressure was 173/89. Per his blood pressure log, his at home readings are typically higher in the mornings. He has checked using multiple machines [employer] and [facility]) in the readings tended to run higher on the [employer].  At that time he was taking amlodipine at night and in the morning, Valsartan in the morning, and doxazosin at night.  Valsartan was switched to the evening.  He noted exertional dyspnea and given that he had coronary calcification, he was referred for a nuclear stress test 07/2021 that revealed LVEF 71% and no ischemia.  He was unable to reach target heart rate on the treadmill and was of converted to [employer].  At the last visit he continued to have higher blood pressures in the morning and lower pressures in the evening. He declined additional medication changes. He noted LE edema and had an echo 11/2021 which revealed an LVEF of 60-65% with mild LVH, grade I diastolic dysfunction, and RA pressure was 3. He followed up with Laurann Montana, NP and his blood pressure was well controlled.  Today, he states that he has been doing well overall. He has been staying active but does have to work slowly during his exercise regimen to mitigate his shortness of breath. He wonders if he can use his Albuterol with exercise to help with his shortness of breath.   He is smoking slightly less than a ppd. He notes trying nicotine patches a few years ago but had skin irritation with these. He does not chew gum due to his  significant periodontal work. We also discussed trying medications such as Chantix or Wellbutrin. He politely declined starting either of these at this time.  He has cut back on his BP checks. He provided his blood pressures over the last week:  Evening BP Maximum of 113/64  Morning BP 116/69 139/82 123/88 136/79  His heart rate has been between 72-76bpm. We discussed the proper blood pressure check routine and encouraged him to rest for ~5 minutes before checking it.  He last saw Dr. Chestine Spore in June 2023 and will see him again in June 2024 for routine follow up.  His prior leg pain and unilateral foot swelling have resolved. The patient denies chest pain, chest pressure, PND, or orthopnea. Denies cough, fever, chills, nausea, or vomiting. Denies syncope, presyncope, or snoring. Denies dizziness or lightheadedness.    He wonders if any of his medications have caused his weight gain. However, he suspects that this may be associated with his deconditioning and diet.   Past Medical History:  Diagnosis Date   Atherosclerosis    Benign essential tremor    CAD in native artery 08/17/2021   Lower extremity edema 12/05/2021   Pure hypercholesterolemia 08/17/2021   RBBB 04/12/2021   Tobacco abuse 04/12/2021    Past Surgical History:  Procedure Laterality Date   ABDOMINAL AORTOGRAM W/LOWER EXTREMITY N/A 01/28/2020   Procedure: ABDOMINAL AORTOGRAM W/LOWER EXTREMITY;  Surgeon: Cephus Shelling, MD;  Location: [facility] INVASIVE CV LAB;  Service: Cardiovascular;  Laterality: N/A;   PERIPHERAL VASCULAR INTERVENTION Bilateral 01/28/2020   Procedure: PERIPHERAL VASCULAR INTERVENTION;  Surgeon: Cephus Shelling, MD;  Location: [facility] INVASIVE CV LAB;  Service: Cardiovascular;  Laterality: Bilateral;  external iliac stents    Current Medications: Current Meds  Medication Sig   albuterol (VENTOLIN HFA) 108 (90 Base) MCG/ACT inhaler Inhale 2 puffs into the lungs every 4 (four) hours as needed for  wheezing or shortness of breath.   AMBULATORY NON FORMULARY MEDICATION Continuous positive airway pressure (CPAP) machine set on AutoPAP (4-20 cmH2O), with all supplemental supplies as needed.   amLODipine (NORVASC) 5 MG tablet Take 1 tablet (5 mg total) by mouth in the morning and at bedtime.   Aspirin 81 MG CAPS Take 81 mg by mouth daily.   atorvastatin (LIPITOR) 20 MG tablet Take 1 tablet (20 mg total) by mouth daily.   chlorhexidine (PERIDEX) 0.12 % solution 2 (two) times daily.   clopidogrel (PLAVIX) 75 MG tablet TAKE 1 TABLET BY MOUTH EVERY DAY (Patient taking differently: Take 75 mg by mouth daily.)   doxazosin (CARDURA) 8 MG tablet TAKE 1 TABLET BY MOUTH AT BEDTIME.   Multiple Vitamin (MULTIVITAMIN WITH MINERALS) TABS tablet Take 1 tablet by mouth daily.   valsartan (DIOVAN) 80 MG tablet TAKE 1 TABLET BY MOUTH EVERY DAY     Allergies:   Patient has no known allergies.   Social History   Socioeconomic History   Marital status: Single    Spouse name: Not on file   Number of children: Not on  file   Years of education: Not on file   Highest education level: Not on file  Occupational History    Comment: Retired.  Tobacco Use   Smoking status: Every Day    Packs/day: 1.00    Years: 30.00    Total pack years: 30.00    Types: Cigarettes   Smokeless tobacco: Never   Tobacco comments:    1 pack a day. ARJ 02/13/22  Vaping Use   Vaping Use: Never used  Substance and Sexual Activity   Alcohol use: Yes    Alcohol/week: 8.0 - 10.0 standard drinks of alcohol    Types: 8 - 10 Standard drinks or equivalent per week   Drug use: Never   Sexual activity: Yes  Other Topics Concern   Not on file  Social History Narrative   Patient did not want to answer   Social Determinants of Health   Financial Resource Strain: Low Risk  (12/08/2020)   Overall Financial Resource Strain (CARDIA)    Difficulty of Paying Living Expenses: Not hard at all  Food Insecurity: No Food Insecurity  (12/08/2020)   Hunger Vital Sign    Worried About Running Out of Food in the Last Year: Never true    Ran Out of Food in the Last Year: Never true  Transportation Needs: No Transportation Needs (12/08/2020)   PRAPARE - Hydrologist (Medical): No    Lack of Transportation (Non-Medical): No  Physical Activity: Inactive (12/08/2020)   Exercise Vital Sign    Days of Exercise per Week: 0 days    Minutes of Exercise per Session: 0 min  Stress: No Stress Concern Present (12/08/2020)   [place]    Feeling of Stress : Not at all  Social Connections: Unknown (12/08/2020)   Social Connection and Isolation Panel [NHANES]    Frequency of Communication with Friends and Family: More than three times a week    Frequency of Social Gatherings with Friends and Family: Three times a week    Attends Religious Services: Patient refused    Active Member of Clubs or Organizations: Patient refused    Attends Archivist Meetings: Patient refused    Marital Status: Patient refused     Family History: The patient's family history includes Hyperlipidemia in his mother; Hypertension in his father and mother.  ROS:   Please see the history of present illness.    + shortness of breath All other systems reviewed and are negative.  EKGs/Labs/Other Studies Reviewed:    Echo 12/23/2021: 1.Left ventricular ejection fraction, by estimation, is 60 to 65%. The left ventricle has normal function. The left ventricle has no regional wall motion abnormalities. There is mild concentric left ventricular hypertrophy. Left ventricular diastolic parameters are consistent with Grade I diastolic dysfunction (impaired relaxation). 2.Right ventricular systolic function is normal. The right ventricular size is mildly enlarged. There is normal pulmonary artery systolic pressure. 3. The mitral valve is normal in structure. No  evidence of mitral valve regurgitation. 4. Tricuspid valve regurgitation is mild to moderate. The aortic valve is tricuspid. Aortic valve regurgitation is not visualized. No aortic stenosis is present. 5.The inferior vena cava is normal in size with greater than 50% respiratory variability, suggesting right atrial pressure of 3 mmHg.  Lexiscan Myoview 08/24/2021:   Findings are consistent with no prior ischemia. The study is low risk.   No ST deviation was noted.   Left ventricular function is  normal. Nuclear stress EF: 71 %. The left ventricular ejection fraction is hyperdynamic (>65%). End diastolic cavity size is normal. End systolic cavity size is normal.   Prior study not available for comparison. Patient was unable to achieve THR on the treadmill, only reaching 76% of MPHR. Study converted to [place]. Perfusion appears normal. Overall low risk study. LVEF 71%. Evaluate for possible chronotropic incompetence given RBBB since he did attempt 10 minutes of exercise, but could not achieve target heart rate. No prior for comparison  CT Chest 01/19/21 -Lung-RADS 2, benign appearance or behavior. Continue annual screening with low-dose chest CT without contrast in 12 months. -Three-vessel coronary artery calcifications, aortic Atherosclerosis (ICD10-I70.0) and Emphysema (ICD10-J43.9).  LE Doppler 11/02/20 Right: Resting right ankle-brachial index indicates moderate right lower  extremity arterial disease. The right toe-brachial index is abnormal. RT  great toe pressure = 113 mmHg.  Left: Resting left ankle-brachial index is within normal range. No  evidence of significant left lower extremity arterial disease. The left  toe-brachial index is normal. LT Great toe pressure = 175 mmHg.   Abdominal Aortogram 01/28/2020: Aortogram showed one right and three left renal arteries that were widely patent.  The infrarenal aorta was widely patent.  On the right he had a patent common iliac and hypogastric  artery but a total occlusion of the right external iliac with reconstitution of the common femoral and then a flush right SFA occlusion and only profunda runoff on the right.  On the left he had a patent common iliac artery and a high-grade 80% calcified stenosis in the left external iliac that we initially had some difficulty crossing retrograde for sheath access.   After evaluating the images then accessed the right common femoral artery retrograde and was able to cross the right external iliac occlusion.  This was primarily stented with an 8 mm Innova postdilated with a 7 mm Mustang.  He now has a right femoral pulse. He also endorses claudication symptoms in the left lower extremity and given access issues with the left external iliac lesion I stentede this with a 8 mm x 40 mm self-expanding Innova and post-dilated this with a 7 mm angioplasty balloon.  The stent was too short and had to place a second 8 mm x 20 mm self expanding Innova.   Bilateral lower extremity runoff after iliac stent placement on the right shows flush SFA occlusion with reconstitution of the distal SFA patent above and below-knee popliteal artery and apparent three-vessel runoff.  On the left he has a patent SFA with some calcific disease but no flow-limiting stenosis as well as a patent above below-knee popliteal artery and two-vessel runoff in the peroneal and posterior tibial.   EKG:  EKG is personally reviewed. EKG was not ordered today, 05/08/22, due to the nature of his telehealth visit. 08/17/2021: EKG was not ordered. 04/12/21: Sinus rhythm, rate 71 bpm; RBBB  Recent Labs: 12/07/2021: B Natriuretic Peptide 50.2 12/10/2021: Hemoglobin 12.8; Magnesium 2.2; Platelets 242 03/22/2022: ALT 19; BUN 10; Creat 0.91; Potassium 4.7; Sodium 134   Recent Lipid Panel    Component Value Date/Time   CHOL 161 03/22/2022 0000   TRIG 57 03/22/2022 0000   HDL 87 03/22/2022 0000   CHOLHDL 1.9 03/22/2022 0000   LDLCALC 61 03/22/2022  0000    Physical Exam:    VS:  BP 136/79   Pulse 72   Ht 6' (1.829 m)   Wt 190 lb (86.2 kg)   BMI 25.77 kg/m  ,  BMI Body mass index is 25.77 kg/m. GENERAL:  Well appearing HEENT: Pupils equal round and reactive, fundi not visualized, oral mucosa unremarkable NECK:  No jugular venous distention, waveform within normal limits, carotid upstroke brisk and symmetric, no bruits, no thyromegaly LUNGS:  Clear to auscultation bilaterally HEART:  RRR.  PMI not displaced or sustained,S1 and S2 within normal limits, no S3, no S4, no clicks, no rubs, no murmurs ABD:  Flat, positive bowel sounds normal in frequency in pitch, no bruits, no rebound, no guarding, no midline pulsatile mass, no hepatomegaly, no splenomegaly EXT:  1+ DP/TP.   No edema, no cyanosis no clubbing SKIN:  No rashes no nodules NEURO:  Cranial nerves II through XII grossly intact, motor grossly intact throughout PSYCH:  Cognitively intact, oriented to person place and time   ASSESSMENT/PLAN:    Essential hypertension Blood pressure has been well-controlled.  It is ranging in the 110s-130s.  Encouraged him to keep exercising.  Continue amlodipine, valsartan and doxazosin.  Tobacco abuse Cessation advised.  He hasn't tolerated patches and his dental work prohibits gum.  We discussed Chantix and Wellbutrin.  He is interested in considering Wellbutrin and will do some more research on it.  Pure hypercholesterolemia Lipids are very well-controlled.  LDL was 61 on 02/2022.  Continue atorvastatin.  CAD in native artery He has no angina.  He was encouraged to increase his exercise.  The goal is at least 150 minutes weekly.  Continue aspirin, atorvastatin, and Plavix.  Critical lower limb ischemia Known PAD with prior interventions.  He has no claudication and follows regularly with vascular.  Continue antiplatelets and statin as above.  Encouraged him to walk more.  Disposition:   Follow up in 6 months with Dr. Skeet Latch     Medication Adjustments/Labs and Tests Ordered: Current medicines are reviewed at length with the patient today.  Concerns regarding medicines are outlined above.   No orders of the defined types were placed in this encounter.  No orders of the defined types were placed in this encounter.   Time:  Today, I have spent 25 minutes with the patient with telehealth technology discussing the above problems.     I,Alexis Herring,acting as a Education administrator for Skeet Latch, MD.,have documented all relevant documentation on the behalf of Skeet Latch, MD,as directed by  Skeet Latch, MD while in the presence of Skeet Latch, MD.  I, [place], MD have reviewed all documentation for this visit.  The documentation of the exam, diagnosis, procedures, and orders on 05/08/2022 are all accurate and complete.   Signed, Skeet Latch, MD  05/08/2022 9:26 AM    [place]

## 2022-05-08 NOTE — Assessment & Plan Note (Signed)
Blood pressure has been well-controlled.  It is ranging in the 110s-130s.  Encouraged him to keep exercising.  Continue amlodipine, valsartan and doxazosin.

## 2022-05-08 NOTE — Patient Instructions (Signed)
Medication Instructions:  Your physician recommends that you continue on your current medications as directed. Please refer to the Current Medication list given to you today.   *If you need a refill on your cardiac medications before your next appointment, please call your pharmacy*  Lab Work: NONE   Testing/Procedures: NONE  Follow-Up: At [facility], you and your health needs are our priority.  As part of our continuing mission to provide you with exceptional heart care, we have created designated Provider Care Teams.  These Care Teams include your primary Cardiologist (physician) and Advanced Practice Providers (APPs -  Physician Assistants and Nurse Practitioners) who all work together to provide you with the care you need, when you need it.  We recommend signing up for the patient portal called "MyChart".  Sign up information is provided on this After Visit Summary.  MyChart is used to connect with patients for Virtual Visits (Telemedicine).  Patients are able to view lab/test results, encounter notes, upcoming appointments, etc.  Non-urgent messages can be sent to your provider as well.   To learn more about what you can do with MyChart, go to [email or web address].    Your next appointment:   6 month(s)  The format for your next appointment:   In Person  Provider:

## 2022-05-08 NOTE — Assessment & Plan Note (Signed)
Known PAD with prior interventions.  He has no claudication and follows regularly with vascular.  Continue antiplatelets and statin as above.  Encouraged him to walk more.

## 2022-05-08 NOTE — Assessment & Plan Note (Signed)
He has no angina.  He was encouraged to increase his exercise.  The goal is at least 150 minutes weekly.  Continue aspirin, atorvastatin, and Plavix.

## 2022-05-08 NOTE — Assessment & Plan Note (Signed)
Lipids are very well-controlled.  LDL was 61 on 02/2022.  Continue atorvastatin.

## 2022-05-08 NOTE — Assessment & Plan Note (Signed)
Cessation advised.  He hasn't tolerated patches and his dental work prohibits gum.  We discussed Chantix and Wellbutrin.  He is interested in considering Wellbutrin and will do some more research on it.

## 2022-06-01 ENCOUNTER — Encounter: Payer: Self-pay | Admitting: Internal Medicine

## 2022-06-01 MED ORDER — ALBUTEROL SULFATE HFA 108 (90 BASE) MCG/ACT IN AERS: 2.0000 | INHALATION_SPRAY | RESPIRATORY_TRACT | 5 refills | Status: DC | PRN

## 2022-06-26 ENCOUNTER — Other Ambulatory Visit: Payer: Self-pay | Admitting: Vascular Surgery

## 2022-07-03 ENCOUNTER — Encounter: Payer: Self-pay | Admitting: Medical-Surgical

## 2022-07-06 ENCOUNTER — Encounter: Payer: Self-pay | Admitting: Medical-Surgical

## 2022-07-06 ENCOUNTER — Ambulatory Visit (INDEPENDENT_AMBULATORY_CARE_PROVIDER_SITE_OTHER): Payer: Medicare Other | Admitting: Medical-Surgical

## 2022-07-06 VITALS — BP 132/75 | HR 75 | Resp 20 | Ht 72.0 in | Wt 200.7 lb

## 2022-07-06 DIAGNOSIS — D692 Other nonthrombocytopenic purpura: Secondary | ICD-10-CM

## 2022-07-06 DIAGNOSIS — I10 Essential (primary) hypertension: Secondary | ICD-10-CM

## 2022-07-06 DIAGNOSIS — M25649 Stiffness of unspecified hand, not elsewhere classified: Secondary | ICD-10-CM | POA: Diagnosis not present

## 2022-07-06 DIAGNOSIS — G8929 Other chronic pain: Secondary | ICD-10-CM

## 2022-07-06 DIAGNOSIS — G4733 Obstructive sleep apnea (adult) (pediatric): Secondary | ICD-10-CM | POA: Diagnosis not present

## 2022-07-06 DIAGNOSIS — M79671 Pain in right foot: Secondary | ICD-10-CM

## 2022-07-06 DIAGNOSIS — M545 Low back pain, unspecified: Secondary | ICD-10-CM | POA: Diagnosis not present

## 2022-07-06 DIAGNOSIS — Z720 Tobacco use: Secondary | ICD-10-CM

## 2022-07-06 MED ORDER — CELECOXIB 100 MG PO CAPS: 100.0000 mg | ORAL_CAPSULE | Freq: Two times a day (BID) | ORAL | 1 refills | Status: DC

## 2022-07-06 NOTE — Progress Notes (Signed)
Established Patient Office Visit  Subjective   Patient ID: Jason Carson, male   DOB: 03/31/1951 Age: 72 y.o. MRN: [id number]   Chief Complaint  Patient presents with   Follow-up   Hypertension    HPI Pleasant 72 year old male presenting today for 46-month follow-up.  He is followed by the advanced hypertension clinic who is now managing his hypertension concerns.  His blood pressure was elevated on arrival however the recheck looked much better.  Reports that he is no longer using his CPAP as it was more of a hindrance and a bother.  He still has the machine at home but feels better using a wedge pillow that keeps his head elevated.  He continues to smoke approximately 1 pack/day and is not interested in smoking cessation options.  Taking his medications as prescribed, tolerating well without side effects.  Denies CP, SOB, palpitations, lower extremity edema, dizziness, headaches, or vision changes.  Has several additional concerns today including low back pain.  Notes this has been occurring for years and seems to have gotten worse recently.  Admits to gaining 10 to 15 pounds over the last couple of years which he is sure is contributing to his discomfort.  Not currently exercising regularly.  Has not tried any over-the-counter medications for his pain.  Notes that sitting down resolves the pain completely while standing in 1 spot makes it worse.  Walking involves a little discomfort but not as much as standing in place.  No recent imaging noted.  Having intermittent, unpredictable pain in his right heel.  Is in a small area and wakes him up at night.  It only occurs while he is in bed sleeping.  When he awakens, he will rub the spot for 30 to 60 seconds and the pain resolves completely.  Notes that there are times when it only happens once a week and other times when it happens twice a night.  No pain on for stepping in the morning or during the day when up and moving about.  Has noted easy  bruisability of his hands and arms that is been going on for a number of years.  Currently on blood thinners and knows that these can make it worse however this was a problem before hand.  Having hand stiffness that mostly affects the right hand however is milder in the left.  Has times when his right index finger feels like it has a catch.  Not limiting his everyday activities and is able to do what ever he needs to do without severe pain.  Not taking any medications to treat this.   Objective:    Vitals:   07/06/22 1052 07/06/22 1053 07/06/22 1112  BP: (!) 165/72 (!) 165/72 132/75  Pulse: 79 79 75  Resp: 20    Height: 6' (1.829 m)    Weight: 200 lb 11.2 oz (91 kg)    SpO2: 96% 96% 95%  BMI (Calculated): 27.21      Physical Exam Vitals reviewed.  Constitutional:      General: He is not in acute distress.    Appearance: Normal appearance. He is not ill-appearing.  HENT:     Head: Normocephalic and atraumatic.  Cardiovascular:     Rate and Rhythm: Normal rate and regular rhythm.     Pulses: Normal pulses.     Heart sounds: Normal heart sounds. No murmur heard.    No friction rub. No gallop.  Pulmonary:     Effort: Pulmonary effort is normal.  No respiratory distress.     Breath sounds: Normal breath sounds.  Skin:    General: Skin is warm and dry.  Neurological:     Mental Status: He is alert and oriented to person, place, and time.  Psychiatric:        Mood and Affect: Mood normal.        Behavior: Behavior normal.        Thought Content: Thought content normal.        Judgment: Judgment normal.   No results found for this or any previous visit (from the past 24 hour(s)).     The 10-year ASCVD risk score (Arnett DK, et al., 2019) is: 21.3%   Values used to calculate the score:     Age: 80 years     Sex: Male     Is Non-Hispanic African American: No     Diabetic: No     Tobacco smoker: Yes     Systolic Blood Pressure: 035 mmHg     Is BP treated: Yes     HDL  Cholesterol: 87 mg/dL     Total Cholesterol: 161 mg/dL   Assessment & Plan:   1. Stiffness of hand joint, unspecified laterality 2. Chronic bilateral low back pain without sciatica Suspect back and hand pains are related to degenerative changes consistent with osteoarthritis.  Getting x-rays of the lumbar spine today.  Starting Celebrex 100 mg twice daily as needed to help with discomfort.  Low back pain exercises provided for completion at home.  Recommend weight management. - DG Lumbar Spine Complete; Future  3. OSA on CPAP Unfortunately not using his CPAP anymore.  Would strongly recommend returning to this due to nighttime hypoxia.  Okay to use a wedge pillow but this will not likely address the OSA adequately.  4. Senile purpura ([place]) Discussed the nature of senile purpura.  Recommend sun protection and frequent moisturizing of the skin.  Recommend barrier protection such as long sleeves and prevention of situations that promote injury.  This is likely worsened by Plavix and aspirin but these medications are necessary at this time.  5. Pain of right heel Unclear etiology.  Reports that the pain is not a huge problem and is manageable.  If this changes, advised him to return for further evaluation.  6. Tobacco abuse Still smoking with no interest in quitting at this time.  Aware of risks of continuing to smoke as well as benefits of smoking cessation.  Aware of options available to help quit if he changes his mind.  7. Essential hypertension Man by the advanced hypertension clinic.  Blood pressure on recheck at goal.  Return in about 6 months (around 01/04/2023) for chronic disease follow up.  ___________________________________________ Clearnce Sorrel, DNP, APRN, FNP-BC [facility]

## 2022-07-10 IMAGING — CT CT CHEST LUNG CANCER SCREENING LOW DOSE W/O CM
2 of 4 series · 15 of 36 positions shown, 18 images · non-contrast
Comparison: None

CLINICAL DATA: Current smoker with 50 pack-year history

EXAM:
CT CHEST WITHOUT CONTRAST LOW-DOSE FOR LUNG CANCER SCREENING
TECHNIQUE: Multidetector CT imaging of the chest was performed following the
standard protocol without IV contrast.

[Series 3: lungs · axial · 0.75mm/px · z∈[-378,-61]mm · 12 of 349 slices shown, 15 images]
[im 16/349  mediastinal]
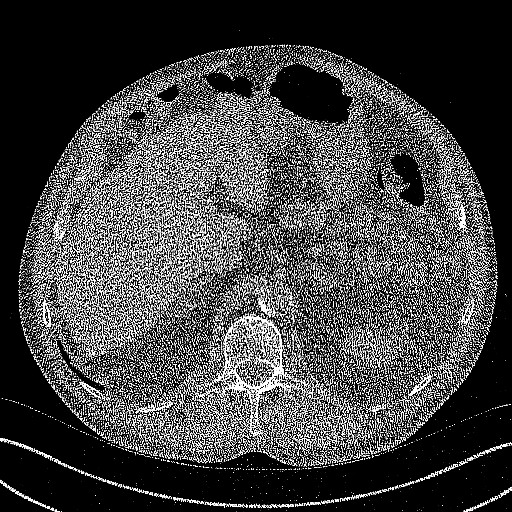
[im 16/349  lung]
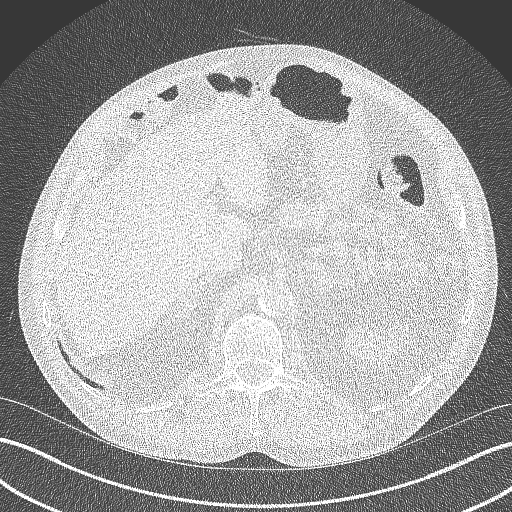
[im 48/349  lung]
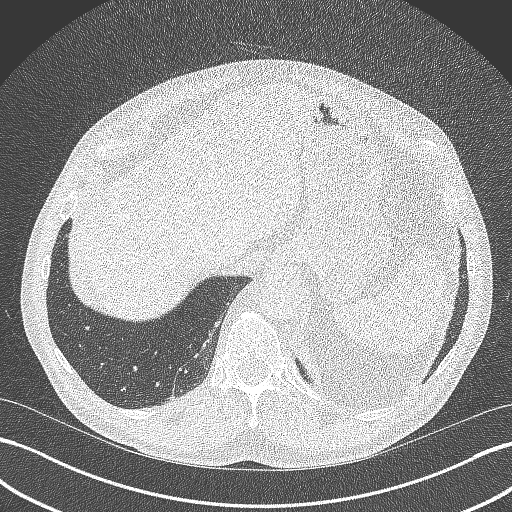
[im 80/349  lung]
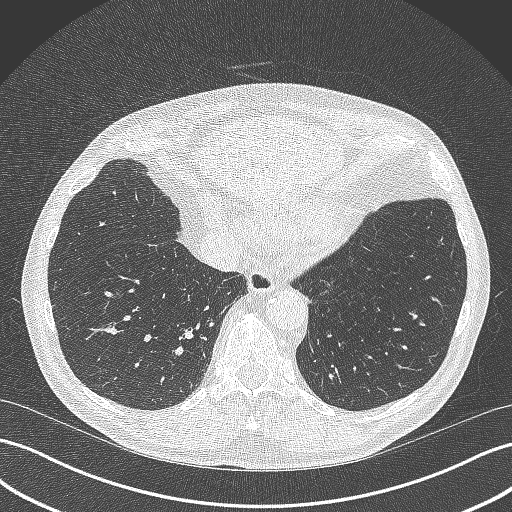
[im 111/349  lung]
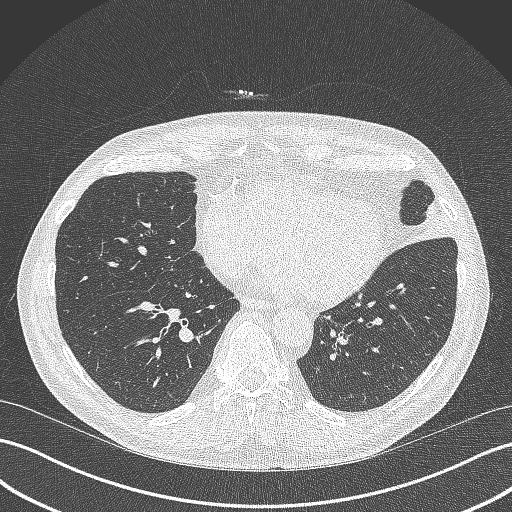
[im 127/349  mediastinal]
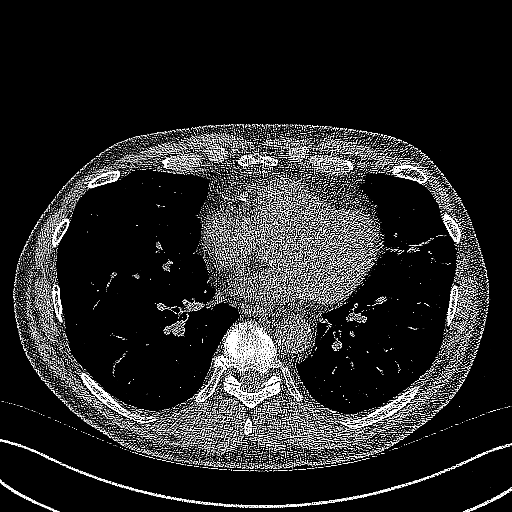
[im 127/349  lung]
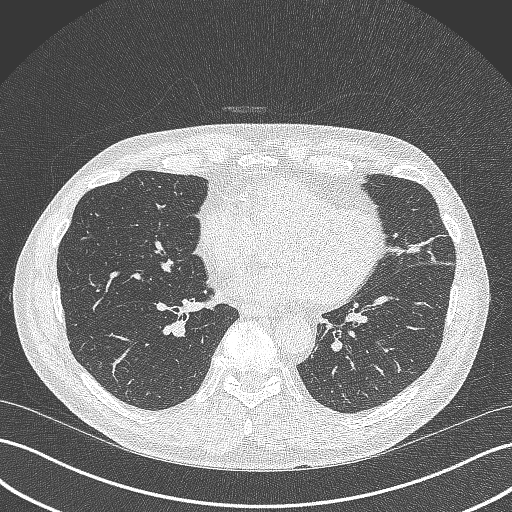
[im 159/349  lung]
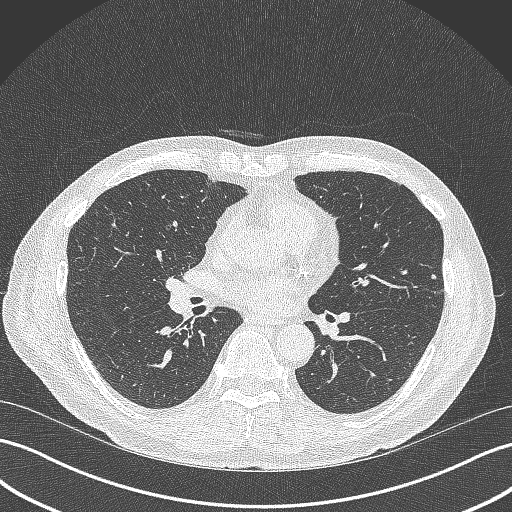
[im 190/349  lung]
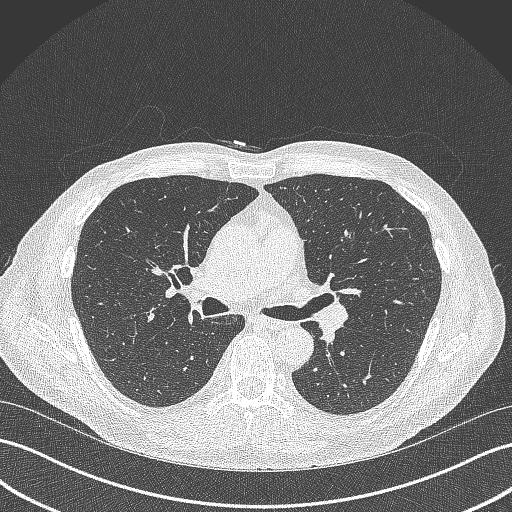
[im 222/349  lung]
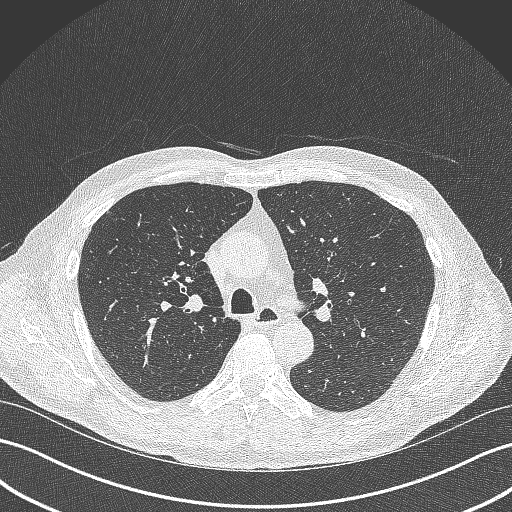
[im 238/349  mediastinal]
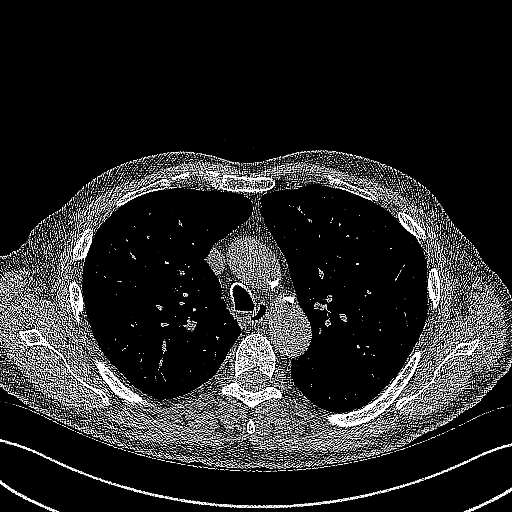
[im 238/349  lung]
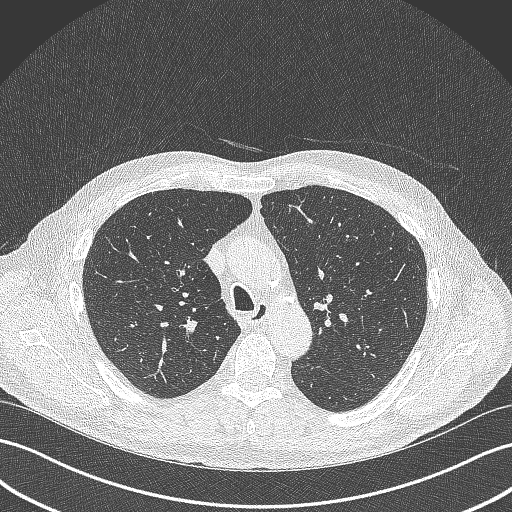
[im 269/349  lung]
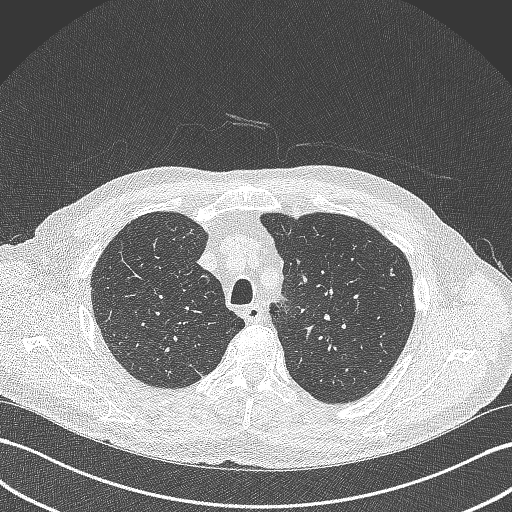
[im 301/349  lung]
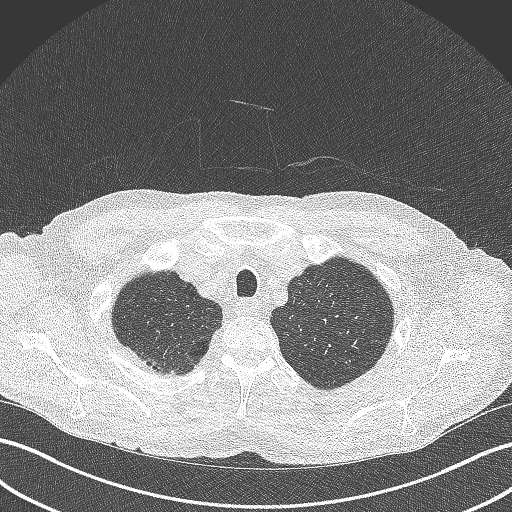
[im 333/349  lung]
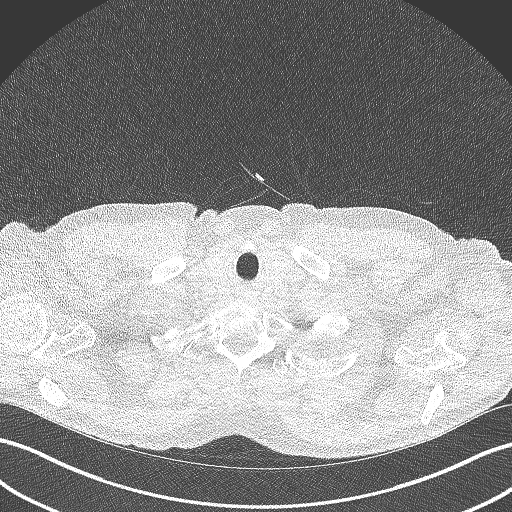

[Series 4: coronal · coronal · 0.71mm/px · 3 of 248 slices shown]
[im 50/248  lung]
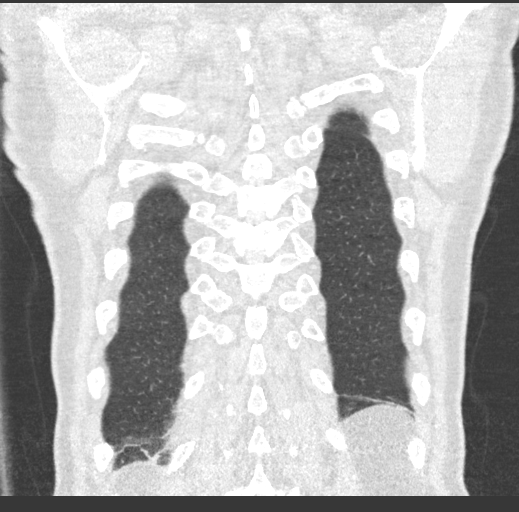
[im 99/248  lung]
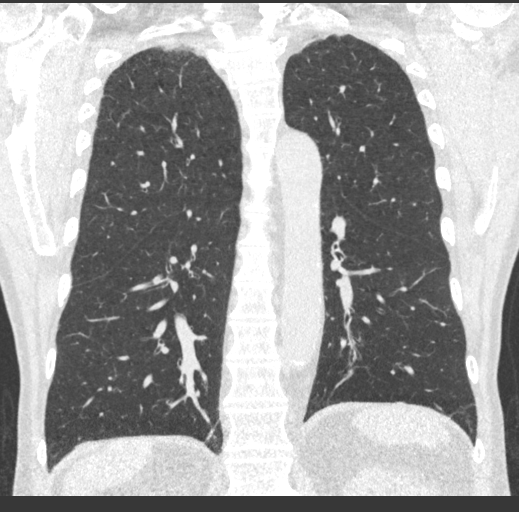
[im 149/248  lung]
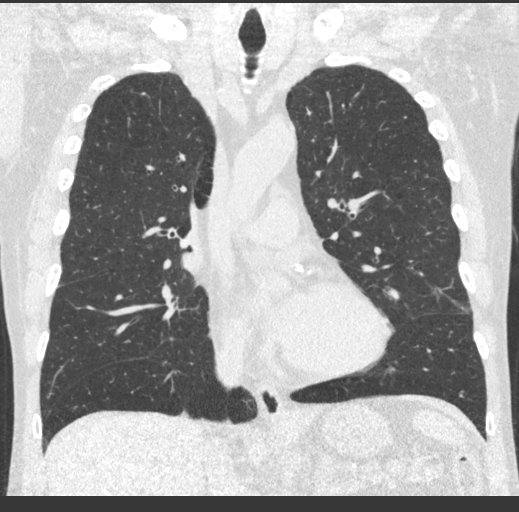

[15 of 36 positions shown; findings below may reference images not displayed]

FINDINGS: Cardiovascular: Normal heart size. Three-vessel coronary artery
calcifications. Atherosclerotic disease of the thoracic aorta. No
pericardial effusion.

Mediastinum/Nodes: Esophagus and thyroid are unremarkable.
Pathologically enlarged lymph nodes seen in the chest.

Lungs/Pleura: Mild upper lobe predominant centrilobular emphysema.
Linear opacity of the lingula, likely atelectasis. No consolidation,
pleural effusion or pneumothorax. Scattered small solid pulmonary
nodules. Reference solid pulmonary nodule of the left lower lobe
measuring 3 mm located on series 3, image 240.

Upper Abdomen: No acute abnormality.

Musculoskeletal: No chest wall mass or suspicious bone lesions
identified.
IMPRESSION: Lung-RADS 2, benign appearance or behavior. Continue annual
screening with low-dose chest CT without contrast in 12 months.

Three-vessel coronary artery calcifications, aortic Atherosclerosis
(X2833-CTH.H) and Emphysema (X2833-L6H.2).

## 2022-08-24 ENCOUNTER — Other Ambulatory Visit: Payer: Self-pay | Admitting: Cardiovascular Disease

## 2022-08-24 NOTE — Telephone Encounter (Signed)
Rx request sent to pharmacy.  

## 2022-09-06 ENCOUNTER — Telehealth: Payer: Self-pay | Admitting: Medical-Surgical

## 2022-09-06 NOTE — Telephone Encounter (Signed)
Called patient to schedule Medicare Annual Wellness Visit (AWV). Left message for patient to call back and schedule Medicare Annual Wellness Visit (AWV).  Last date of AWV: 2022  Please schedule an appointment at any time with [facility].  If any questions, please contact me at [phone].  Thank you ,  Morgan Jessup Patient Access Advocate II Direct Dial: [phone]   

## 2022-09-11 ENCOUNTER — Other Ambulatory Visit: Payer: Self-pay | Admitting: Cardiovascular Disease

## 2022-09-11 NOTE — Telephone Encounter (Signed)
Rx(s) sent to pharmacy electronically.  

## 2022-09-28 DIAGNOSIS — K080 Exfoliation of teeth due to systemic causes: Secondary | ICD-10-CM | POA: Diagnosis not present

## 2022-10-10 DIAGNOSIS — K080 Exfoliation of teeth due to systemic causes: Secondary | ICD-10-CM | POA: Diagnosis not present

## 2022-11-10 ENCOUNTER — Other Ambulatory Visit: Payer: Self-pay | Admitting: *Deleted

## 2022-11-10 DIAGNOSIS — I739 Peripheral vascular disease, unspecified: Secondary | ICD-10-CM

## 2022-11-10 DIAGNOSIS — I70229 Atherosclerosis of native arteries of extremities with rest pain, unspecified extremity: Secondary | ICD-10-CM

## 2022-11-22 ENCOUNTER — Ambulatory Visit: Payer: Medicare Other | Admitting: Physician Assistant

## 2022-11-22 ENCOUNTER — Ambulatory Visit (HOSPITAL_COMMUNITY)
Admission: RE | Admit: 2022-11-22 | Discharge: 2022-11-22 | Disposition: A | Discharge status: Home / Self Care | Payer: Medicare Other | Source: Ambulatory Visit | Attending: Vascular Surgery | Admitting: Vascular Surgery

## 2022-11-22 ENCOUNTER — Ambulatory Visit (INDEPENDENT_AMBULATORY_CARE_PROVIDER_SITE_OTHER)
Admission: RE | Admit: 2022-11-22 | Discharge: 2022-11-22 | Disposition: A | Discharge status: Home / Self Care | Payer: Medicare Other | Source: Ambulatory Visit | Attending: Vascular Surgery | Admitting: Vascular Surgery

## 2022-11-22 ENCOUNTER — Ambulatory Visit (INDEPENDENT_AMBULATORY_CARE_PROVIDER_SITE_OTHER): Payer: Medicare Other

## 2022-11-22 VITALS — BP 157/82 | HR 76 | Temp 97.8°F | Resp 18 | Ht 72.0 in | Wt 196.0 lb

## 2022-11-22 DIAGNOSIS — I70229 Atherosclerosis of native arteries of extremities with rest pain, unspecified extremity: Secondary | ICD-10-CM

## 2022-11-22 DIAGNOSIS — G8929 Other chronic pain: Secondary | ICD-10-CM

## 2022-11-22 DIAGNOSIS — I739 Peripheral vascular disease, unspecified: Secondary | ICD-10-CM | POA: Insufficient documentation

## 2022-11-22 DIAGNOSIS — M5136 Other intervertebral disc degeneration, lumbar region: Secondary | ICD-10-CM | POA: Diagnosis not present

## 2022-11-22 DIAGNOSIS — M545 Low back pain, unspecified: Secondary | ICD-10-CM

## 2022-11-22 DIAGNOSIS — I700 Atherosclerosis of aorta: Secondary | ICD-10-CM | POA: Diagnosis not present

## 2022-11-22 DIAGNOSIS — M47816 Spondylosis without myelopathy or radiculopathy, lumbar region: Secondary | ICD-10-CM | POA: Diagnosis not present

## 2022-11-22 LAB — VAS US ABI WITH/WO TBI
Left ABI: 0.99
Right ABI: 0.62

## 2022-11-22 NOTE — Progress Notes (Signed)
Office Note     CC:  follow up Requesting Provider:  Christen Butter, NP  HPI: Jason Carson is a 72 y.o. ([birth year]) male who presents for routine follow up of peripheral artery disease. He has history of right external iliac artery angioplasty and stenting and left external iliac artery angioplasty with stent placement in September of 2021 by Dr. Chestine Spore. This was for lifestyle limiting claudication. Post intervention his claudication has been resolved.   Today he reports no pain on ambulation or rest. No non healing wounds. His says he does not do any exercise but remains fairly active with his ADLs. He is however recently more limited in his activity due to shortness of breath. Recently diagnosed with COPD. He also says intermittently he will get numb like sensation to touch in his thighs. This is not changed with activity and will resolve on his own. He does report issues with his back and actually had appointment for lumbar x rays today ordered by his PCP for evaluation. He continues to smoke daily about 1/2 pack. He is medically managed on Asprin, Plavix, statin  The pt is on a statin for cholesterol management.    The pt is on an aspirin.    Other AC:  Plavix The pt is on CCB, ARB, diuretic for hypertension.  The pt does not have diabetes. Tobacco hx:  current  Past Medical History:  Diagnosis Date   Atherosclerosis    Benign essential tremor    CAD in native artery 08/17/2021   Lower extremity edema 12/05/2021   Pure hypercholesterolemia 08/17/2021   RBBB 04/12/2021   Tobacco abuse 04/12/2021    Past Surgical History:  Procedure Laterality Date   ABDOMINAL AORTOGRAM W/LOWER EXTREMITY N/A 01/28/2020   Procedure: ABDOMINAL AORTOGRAM W/LOWER EXTREMITY;  Surgeon: Cephus Shelling, MD;  Location: [facility];  Service: Cardiovascular;  Laterality: N/A;   PERIPHERAL VASCULAR INTERVENTION Bilateral 01/28/2020   Procedure: PERIPHERAL VASCULAR INTERVENTION;  Surgeon: Cephus Shelling, MD;  Location: [facility];  Service: Cardiovascular;  Laterality: Bilateral;  external iliac stents    Social History   Socioeconomic History   Marital status: Single    Spouse name: Not on file   Number of children: Not on file   Years of education: Not on file   Highest education level: Not on file  Occupational History    Comment: Retired.  Tobacco Use   Smoking status: Every Day    Packs/day: 1.00    Years: 30.00    Additional pack years: 0.00    Total pack years: 30.00    Types: Cigarettes   Smokeless tobacco: Never   Tobacco comments:    1 pack a day. ARJ 02/13/22  Vaping Use   Vaping Use: Never used  Substance and Sexual Activity   Alcohol use: Yes    Alcohol/week: 8.0 - 10.0 standard drinks of alcohol    Types: 8 - 10 Standard drinks or equivalent per week   Drug use: Never   Sexual activity: Yes  Other Topics Concern   Not on file  Social History Narrative   Patient did not want to answer   Social Determinants of Health   Financial Resource Strain: Low Risk  (12/08/2020)   Overall Financial Resource Strain (CARDIA)    Difficulty of Paying Living Expenses: Not hard at all  Food Insecurity: No Food Insecurity (12/08/2020)   Hunger Vital Sign    Worried About Running Out of Food in the Last  Year: Never true    Ran Out of Food in the Last Year: Never true  Transportation Needs: No Transportation Needs (12/08/2020)   PRAPARE - Administrator, Civil Service (Medical): No    Lack of Transportation (Non-Medical): No  Physical Activity: Inactive (12/08/2020)   Exercise Vital Sign    Days of Exercise per Week: 0 days    Minutes of Exercise per Session: 0 min  Stress: No Stress Concern Present (12/08/2020)   [employer] of Occupational Health - Occupational Stress Questionnaire    Feeling of Stress : Not at all  Social Connections: Unknown (12/08/2020)   Social Connection and Isolation Panel [NHANES]    Frequency of  Communication with Friends and Family: More than three times a week    Frequency of Social Gatherings with Friends and Family: Three times a week    Attends Religious Services: Patient declined    Active Member of Clubs or Organizations: Patient declined    Attends Banker Meetings: Patient declined    Marital Status: Patient declined  Intimate Partner Violence: Not At Risk (12/08/2020)   Humiliation, Afraid, Rape, and Kick questionnaire    Fear of Current or Ex-Partner: No    Emotionally Abused: No    Physically Abused: No    Sexually Abused: No    Family History  Problem Relation Age of Onset   Hypertension Mother    Hyperlipidemia Mother    Hypertension Father     Current Outpatient Medications  Medication Sig Dispense Refill   albuterol (VENTOLIN HFA) 108 (90 Base) MCG/ACT inhaler Inhale 2 puffs into the lungs every 4 (four) hours as needed for wheezing or shortness of breath. 8.5 g 5   AMBULATORY NON FORMULARY MEDICATION Continuous positive airway pressure (CPAP) machine set on AutoPAP (4-20 cmH2O), with all supplemental supplies as needed. 1 each 0   amLODipine (NORVASC) 5 MG tablet TAKE 1 TABLET (5 MG TOTAL) BY MOUTH IN THE MORNING AND AT BEDTIME 180 tablet 1   Aspirin 81 MG CAPS Take 81 mg by mouth daily.     atorvastatin (LIPITOR) 20 MG tablet Take 1 tablet (20 mg total) by mouth daily. 90 tablet 3   celecoxib (CELEBREX) 100 MG capsule Take 1 capsule (100 mg total) by mouth 2 (two) times daily. 60 capsule 1   chlorhexidine (PERIDEX) 0.12 % solution 2 (two) times daily.     clopidogrel (PLAVIX) 75 MG tablet TAKE 1 TABLET BY MOUTH EVERY DAY 90 tablet 2   doxazosin (CARDURA) 8 MG tablet TAKE 1 TABLET BY MOUTH AT BEDTIME. 90 tablet 1   Multiple Vitamin (MULTIVITAMIN WITH MINERALS) TABS tablet Take 1 tablet by mouth daily.     valsartan (DIOVAN) 80 MG tablet TAKE 1 TABLET BY MOUTH EVERY DAY 90 tablet 3   No current facility-administered medications for this visit.     No Known Allergies   REVIEW OF SYSTEMS:   [X]  denotes positive finding, [ ]  denotes negative finding Cardiac  Comments:  Chest pain or chest pressure:    Shortness of breath upon exertion: X   Short of breath when lying flat:    Irregular heart rhythm:        Vascular    Pain in calf, thigh, or hip brought on by ambulation:    Pain in feet at night that wakes you up from your sleep:     Blood clot in your veins:    Leg swelling:  Pulmonary    Oxygen at home:    Productive cough:     Wheezing:         Neurologic    Sudden weakness in arms or legs:     Sudden numbness in arms or legs:     Sudden onset of difficulty speaking or slurred speech:    Temporary loss of vision in one eye:     Problems with dizziness:         Gastrointestinal    Blood in stool:     Vomited blood:         Genitourinary    Burning when urinating:     Blood in urine:        Psychiatric    Major depression:         Hematologic    Bleeding problems:    Problems with blood clotting too easily:        Skin    Rashes or ulcers:        Constitutional    Fever or chills:      PHYSICAL EXAMINATION:  Vitals:   11/22/22 0941  BP: (!) 157/82  Pulse: 76  Resp: 18  Temp: 97.8 F (36.6 C)  TempSrc: Temporal  SpO2: 95%  Weight: 196 lb (88.9 kg)  Height: 6' (1.829 m)    General:  WDWN in NAD; vital signs documented above Gait: Normal HENT: WNL, normocephalic Pulmonary: normal non-labored breathing , without wheezing Cardiac: regular HR Abdomen: distended, soft, non tender Vascular Exam/Pulses: 2+ femoral pulses bilaterally,  Extremities: without ischemic changes, without Gangrene , without cellulitis; without open wounds;  Musculoskeletal: no muscle wasting or atrophy  Neurologic: A&O X 3 Psychiatric:  The pt has Normal affect.   Non-Invasive Vascular Imaging:   +-------+-----------+-----------+------------+------------+  ABI/TBIToday's ABIToday's TBIPrevious  ABIPrevious TBI  +-------+-----------+-----------+------------+------------+  Right 0.62       0.58       0.74        0.66          +-------+-----------+-----------+------------+------------+  Left  0.99       0.76       1.00        0.89          +-------+-----------+-----------+------------+------------+   VAS US Aorta/IVC/ Iliacs: Summary:  Stenosis: +--------------------+-----------+  Location            Stent        +--------------------+-----------+  Right External Iliacno stenosis  +--------------------+-----------+  Left External Iliac no stenosis  +--------------------+-----------+    ASSESSMENT/PLAN:: 72 y.o. male here for follow up for peripheral artery disease. He has history of right external iliac artery angioplasty and stenting and left external iliac artery angioplasty with stent placement in September of 2021 by Dr. Chestine Spore. This was for lifestyle limiting claudication. Post intervention his claudication remains resolved. He is without any rest pain or tissue loss.  - ABI today shows slight decrease bilaterally. Duplex however shows patent iliac stents. Suspect more infrainguinal disease - Encourage exercise regimen or continued mobilization as much as possible - Encourage smoking cessation - continue Aspirin, Statin, Plavix - Follow up in 1 year with repeat Aorto/ iliac duplex and ABIs   Graceann Congress, PA-C [facility] [phone]  Clinic MD:   Cain/ Edilia Bo

## 2022-12-09 ENCOUNTER — Other Ambulatory Visit: Payer: Self-pay

## 2022-12-09 DIAGNOSIS — I739 Peripheral vascular disease, unspecified: Secondary | ICD-10-CM

## 2022-12-09 DIAGNOSIS — I70229 Atherosclerosis of native arteries of extremities with rest pain, unspecified extremity: Secondary | ICD-10-CM

## 2022-12-24 ENCOUNTER — Other Ambulatory Visit: Payer: Self-pay | Admitting: Medical-Surgical

## 2023-01-04 ENCOUNTER — Encounter: Payer: Self-pay | Admitting: Medical-Surgical

## 2023-01-04 ENCOUNTER — Ambulatory Visit (INDEPENDENT_AMBULATORY_CARE_PROVIDER_SITE_OTHER): Payer: Medicare Other | Admitting: Medical-Surgical

## 2023-01-04 VITALS — BP 151/77 | HR 73 | Ht 72.0 in | Wt 199.0 lb

## 2023-01-04 DIAGNOSIS — G8929 Other chronic pain: Secondary | ICD-10-CM | POA: Diagnosis not present

## 2023-01-04 DIAGNOSIS — M545 Low back pain, unspecified: Secondary | ICD-10-CM

## 2023-01-04 DIAGNOSIS — I10 Essential (primary) hypertension: Secondary | ICD-10-CM | POA: Diagnosis not present

## 2023-01-04 DIAGNOSIS — G4733 Obstructive sleep apnea (adult) (pediatric): Secondary | ICD-10-CM | POA: Diagnosis not present

## 2023-01-04 NOTE — Progress Notes (Signed)
        Established patient visit  History, exam, impression, and plan:  1. Chronic bilateral low back pain without sciatica Pleasant 72 year old male presenting today with a history of chronic low back pain bilaterally.  Has been doing well overall and feels that his symptoms are manageable when doing exercises at home.  He picked up Celebrex however never took the prescription after reading the potential side effects of the medication.  Reviewed previous imaging of the lumbar spine showing fusion of L2-3 and L4-5.  Discussed symptoms and continue conservative measures.  No additional interventions at this time however if his pain worsens, would like him to reach out for possible physical therapy or further evaluation.  2. OSA on CPAP History of OSA with hypoxemia.  He does have CPAP at home however has not been using this over the past year since it did not seem to help with his blood pressure.  Sleeping well overall.  Considering restarting his CPAP machine.  Checking CBC today. - CBC with Differential/Platelet  3. Essential hypertension Followed by cardiology.  Taking medications as prescribed.  Looks like they have not updated labs in a while so plan to check those today. - CBC with Differential/Platelet - Lipid panel - CMP14+EGFR   Procedures performed this visit: None.  Return in about 6 months (around 07/07/2023) for chronic disease follow up.  __________________________________ Thayer Ohm, DNP, APRN, FNP-BC Primary Care and Sports Medicine [facility] [place]

## 2023-01-10 DIAGNOSIS — K080 Exfoliation of teeth due to systemic causes: Secondary | ICD-10-CM | POA: Diagnosis not present

## 2023-01-22 ENCOUNTER — Ambulatory Visit (INDEPENDENT_AMBULATORY_CARE_PROVIDER_SITE_OTHER): Payer: Medicare Other

## 2023-01-22 DIAGNOSIS — F17210 Nicotine dependence, cigarettes, uncomplicated: Secondary | ICD-10-CM | POA: Diagnosis not present

## 2023-01-22 DIAGNOSIS — Z122 Encounter for screening for malignant neoplasm of respiratory organs: Secondary | ICD-10-CM

## 2023-01-22 DIAGNOSIS — Z87891 Personal history of nicotine dependence: Secondary | ICD-10-CM

## 2023-01-30 ENCOUNTER — Other Ambulatory Visit: Payer: Self-pay | Admitting: Acute Care

## 2023-01-30 DIAGNOSIS — F17210 Nicotine dependence, cigarettes, uncomplicated: Secondary | ICD-10-CM

## 2023-01-30 DIAGNOSIS — Z87891 Personal history of nicotine dependence: Secondary | ICD-10-CM

## 2023-01-30 DIAGNOSIS — Z122 Encounter for screening for malignant neoplasm of respiratory organs: Secondary | ICD-10-CM

## 2023-02-01 ENCOUNTER — Other Ambulatory Visit (HOSPITAL_BASED_OUTPATIENT_CLINIC_OR_DEPARTMENT_OTHER): Payer: Self-pay | Admitting: Family

## 2023-02-01 NOTE — Telephone Encounter (Signed)
Rx request sent to pharmacy.  

## 2023-03-10 ENCOUNTER — Other Ambulatory Visit: Payer: Self-pay | Admitting: Cardiovascular Disease

## 2023-03-16 ENCOUNTER — Ambulatory Visit: Payer: Medicare Other | Admitting: Internal Medicine

## 2023-03-16 ENCOUNTER — Encounter: Payer: Self-pay | Admitting: Internal Medicine

## 2023-03-16 VITALS — BP 134/70 | HR 71 | Ht 72.0 in | Wt 200.0 lb

## 2023-03-16 DIAGNOSIS — F17200 Nicotine dependence, unspecified, uncomplicated: Secondary | ICD-10-CM | POA: Diagnosis not present

## 2023-03-16 DIAGNOSIS — J4489 Other specified chronic obstructive pulmonary disease: Secondary | ICD-10-CM | POA: Diagnosis not present

## 2023-03-16 DIAGNOSIS — J439 Emphysema, unspecified: Secondary | ICD-10-CM

## 2023-03-16 MED ORDER — BREZTRI AEROSPHERE 160-9-4.8 MCG/ACT IN AERO: 2.0000 | INHALATION_SPRAY | Freq: Two times a day (BID) | RESPIRATORY_TRACT | 5 refills | Status: DC

## 2023-03-16 NOTE — Progress Notes (Signed)
Jason Carson    [id number]    13-Mar-1951  Primary Care Physician:Jessup, [name], NP Date of Appointment: 03/16/2023 Established Patient Visit  Chief complaint:   Chief Complaint  Patient presents with   Follow-up    Pt is here for COPD/OSA F/U visit. Pt c/o SOB, Coughing with white foamy mucus, and Chest Congestion.     HPI: Jason Carson is a 72 y.o. man with history of PVD s/p iliac stenting, tobacco use disorder, COPDsevere FEV1 48%, OSA not wearing cpap  Interval Updates: Still smoking. Here for worsening copd symptoms  Was worried about the side effects of taking albuterol.   Interested in quitting smoking.   I have reviewed the patient's family social and past medical history and updated as appropriate.   Past Medical History:  Diagnosis Date   Atherosclerosis    Benign essential tremor    CAD in native artery 08/17/2021   Lower extremity edema 12/05/2021   Pure hypercholesterolemia 08/17/2021   RBBB 04/12/2021   Tobacco abuse 04/12/2021    Past Surgical History:  Procedure Laterality Date   ABDOMINAL AORTOGRAM W/LOWER EXTREMITY N/A 01/28/2020   Procedure: ABDOMINAL AORTOGRAM W/LOWER EXTREMITY;  Surgeon: Cephus Shelling, MD;  Location: [facility];  Service: Cardiovascular;  Laterality: N/A;   PERIPHERAL VASCULAR INTERVENTION Bilateral 01/28/2020   Procedure: PERIPHERAL VASCULAR INTERVENTION;  Surgeon: Cephus Shelling, MD;  Location: [facility];  Service: Cardiovascular;  Laterality: Bilateral;  external iliac stents    Family History  Problem Relation Age of Onset   Hypertension Mother    Hyperlipidemia Mother    Hypertension Father     Social History   Occupational History    Comment: Retired.  Tobacco Use   Smoking status: Every Day    Current packs/day: 1.00    Average packs/day: 1 pack/day for 30.0 years (30.0 ttl pk-yrs)    Types: Cigarettes   Smokeless tobacco: Never   Tobacco comments:    1 pack a day. ARJ  02/13/22  Vaping Use   Vaping status: Never Used  Substance and Sexual Activity   Alcohol use: Yes    Alcohol/week: 8.0 - 10.0 standard drinks of alcohol    Types: 8 - 10 Standard drinks or equivalent per week   Drug use: Never   Sexual activity: Yes     Physical Exam: Blood pressure 134/70, pulse 71, height 6' (1.829 m), weight 200 lb (90.7 kg), SpO2 96%.  Gen:      No acute distress ENT:  no nasal polyps, mucus membranes moist Lungs:    No increased respiratory effort, symmetric chest wall excursion, clear to auscultation bilaterally, no wheezes or crackles CV:         Regular rate and rhythm; no murmurs, rubs, or gallops.  No pedal edema   Data Reviewed: Imaging: I have personally reviewed the CT Chest August 2023 - paraseptal and centrilobular emphysema. No nodules or masses suspicious for primary lung malignancy,   PFTs:     Latest Ref Rng & Units 02/08/2022    3:48 PM  PFT Results  FVC-Pre L 2.75   FVC-Predicted Pre % 58   FVC-Post L 3.11   FVC-Predicted Post % 65   Pre FEV1/FVC % % 62   Post FEV1/FCV % % 59   FEV1-Pre L 1.70   FEV1-Predicted Pre % 48   FEV1-Post L 1.82   DLCO uncorrected ml/min/mmHg 18.99   DLCO UNC% % 69  DLCO corrected ml/min/mmHg 18.99   DLCO COR %Predicted % 69   DLVA Predicted % 80   TLC L 6.42   TLC % Predicted % 86   RV % Predicted % 130    I have personally reviewed the patient's PFTs and Severe airflow limitation with significant response to bronchodilator.   Labs:  Immunization status: Immunization History  Administered Date(s) Administered   Fluad Quad(high Dose 65+) 02/09/2020   [place] D[employer] ([employer]) SARS-COV-2 Vaccination 09/05/2019    External Records Personally Reviewed:   Assessment:  Severe COPD FEV1 48% of predicted Ongoing tobacco use disorder OSA not CPAP Lung cancer screening  Plan/Recommendations:  Start breztri 2 puffs twice daily, gargle after use.  Continue albuterol inhaler as needed for shortness of  breath, wheezing, chest tightness.  Let me know if you want to try chantix for helping quit smoking.  Next due for a ct scan for lung cancer screening in September 2025.   Smoking Cessation Counseling:  1. The patient is an everyday smoker and symptomatic due to the following condition COPD 2. The patient is currently contemplative in quitting smoking. 3. I advised patient to quit smoking. 4. We identified patient specific barriers to change.  5. I personally spent 4 minutes counseling the patient regarding tobacco use disorder. 6. We discussed management of stress and anxiety to help with smoking cessation, when applicable. 7. We discussed nicotine replacement therapy, Wellbutrin, Chantix as possible options. 8. I advised setting a quit date. 9. Follow?up arranged with our office to continue ongoing discussions. 10.Resources given to patient including quit hotline.    Return to Care: Return in about 6 months (around 09/14/2023).   Durel Salts, MD Pulmonary and Critical Care Medicine [facility] [phone]

## 2023-03-16 NOTE — Patient Instructions (Addendum)
It was a pleasure to see you today!  Please schedule follow up scheduled with myself in 6 months.  If my schedule is not open yet, we will contact you with a reminder closer to that time. Please call [phone] if you haven't heard from [place] a month before, and always call us sooner if issues or concerns arise. You can also send [place] a message through MyChart, but but aware that this is not to be used for urgent issues and it may take up to 5-7 days to receive a reply. Please be aware that you will likely be able to view your results before I have a chance to respond to them. Please give [place] 5 business days to respond to any non-urgent results.   Start breztri 2 puffs twice daily, gargle after use.  Continue albuterol inhaler as needed for shortness of breath, wheezing, chest tightness.  Let me know if you want to try chantix for helping quit smoking.  CT scan looks good.  Next due for a ct scan for lung cancer screening in September 2025.   What are the benefits of quitting smoking? Quitting smoking can lower your chances of getting or dying from heart disease, lung disease, kidney failure, infection, or cancer. It can also lower your chances of getting osteoporosis, a condition that makes your bones weak. Plus, quitting smoking can help your skin look younger and reduce the chances that you will have problems with sex.  Quitting smoking will improve your health no matter how old you are, and no matter how long or how much you have smoked.  What should I do if I want to quit smoking? The letters in the word "START" can help you remember the steps to take: S = Set a quit date. T = Tell family, friends, and the people around you that you plan to quit. A = Anticipate or plan ahead for the tough times you'll face while quitting. R = Remove cigarettes and other tobacco products from your home, car, and work. T = Talk to your doctor about getting help to quit.  How can my doctor or nurse help? Your  doctor or nurse can give you advice on the best way to quit. He or she can also put you in touch with counselors or other people you can call for support. Plus, your doctor or nurse can give you medicines to: ?Reduce your craving for cigarettes ?Reduce the unpleasant symptoms that happen when you stop smoking (called "withdrawal symptoms"). You can also get help from a free phone line (1-800-QUIT-NOW) or go online to [employer].  What are the symptoms of withdrawal? The symptoms include: ?Trouble sleeping ?Being irritable, anxious or restless ?Getting frustrated or angry ?Having trouble thinking clearly  Some people who stop smoking become temporarily depressed. Some people need treatment for depression, such as counseling or antidepressant medicines. Depressed people might: ?No longer enjoy or care about doing the things they used to like to do ?Feel sad, down, hopeless, nervous, or cranky most of the day, almost every day ?Lose or gain weight ?Sleep too much or too little ?Feel tired or like they have no energy ?Feel guilty or like they are worth nothing ?Forget things or feel confused ?Move and speak more slowly than usual ?Act restless or have trouble staying still ?Think about death or suicide  If you think you might be depressed, see your doctor or nurse. Only someone trained in mental health can tell for sure if you are  depressed. If you ever feel like you might hurt yourself, go straight to the nearest emergency department. Or you can call for an ambulance (in the [place] and [place], dial 9-1-1) or call your doctor or nurse right away and tell them it is an emergency. You can also reach the [place] National Suicide Prevention Lifeline at 864-649-8650 or http://hill.com/.  How do medicines help you stop smoking? Different medicines work in different ways: ?Nicotine replacement therapy eases withdrawal and reduces your body's craving for nicotine, the main drug  found in cigarettes. There are different forms of nicotine replacement, including skin patches, lozenges, gum, nasal sprays, and "puffers" or inhalers. Many can be bought without a prescription, while others might require one. ?Bupropion is a prescription medicine that reduces your desire to smoke. This medicine is sold under the brand names Zyban and Wellbutrin. It is also available in a generic version, which is cheaper than brand name medicines. ?Varenicline (brand names: Chantix, Champix) is a prescription medicine that reduces withdrawal symptoms and cigarette cravings. If you think you'd like to take varenicline and you have a history of depression, anxiety, or heart disease, discuss this with your doctor or nurse before taking the medicine. Varenicline can also increase the effects of alcohol in some people. It's a good idea to limit drinking while you're taking it, at least until you know how it affects you.  How does counseling work? Counseling can happen during formal office visits or just over the phone. A counselor can help you: ?Figure out what triggers your smoking and what to do instead ?Overcome cravings ?Figure out what went wrong when you tried to quit before  What works best? Studies show that people have the best luck at quitting if they take medicines to help them quit and work with a Veterinary surgeon. It might also be helpful to combine nicotine replacement with one of the prescription medicines that help people quit. In some cases, it might even make sense to take bupropion and varenicline together.  What about e-cigarettes? Sometimes people wonder if using electronic cigarettes, or "e-cigarettes," might help them quit smoking. Using e-cigarettes is also called "vaping." Doctors do not recommend e-cigarettes in place of medicines and counseling. That's because e-cigarettes still contain nicotine as well as other substances that might be harmful. It's not clear how they can affect a  person's health in the long term.  Will I gain weight if I quit? Yes, you might gain a few pounds. But quitting smoking will have a much more positive effect on your health than weighing a few pounds more. Plus, you can help prevent some weight gain by being more active and eating less. Taking the medicine bupropion might help control weight gain.   What else can I do to improve my chances of quitting? You can: ?Start exercising. ?Stay away from smokers and places that you associate with smoking. If people close to you smoke, ask them to quit with you. ?Keep gum, hard candy, or something to put in your mouth handy. If you get a craving for a cigarette, try one of these instead. ?Don't give up, even if you start smoking again. It takes most people a few tries before they succeed.  What if I am pregnant and I smoke? If you are pregnant, it's really important for the health of your baby that you quit. Ask your doctor what options you have, and what is safest for your baby

## 2023-03-16 NOTE — Progress Notes (Signed)
The patient has been prescribed the inhaler breztri. Inhaler technique was demonstrated to patient. The patient subsequently demonstrated correct technique. ° °

## 2023-03-18 ENCOUNTER — Other Ambulatory Visit: Payer: Self-pay | Admitting: Vascular Surgery

## 2023-06-06 DIAGNOSIS — K080 Exfoliation of teeth due to systemic causes: Secondary | ICD-10-CM | POA: Diagnosis not present

## 2023-06-20 ENCOUNTER — Other Ambulatory Visit: Payer: Self-pay | Admitting: Medical-Surgical

## 2023-07-10 ENCOUNTER — Encounter: Payer: Self-pay | Admitting: Medical-Surgical

## 2023-07-10 ENCOUNTER — Ambulatory Visit (INDEPENDENT_AMBULATORY_CARE_PROVIDER_SITE_OTHER): Payer: Medicare Other | Admitting: Medical-Surgical

## 2023-07-10 VITALS — BP 118/68 | HR 75 | Resp 20 | Ht 72.0 in | Wt 193.0 lb

## 2023-07-10 DIAGNOSIS — I10 Essential (primary) hypertension: Secondary | ICD-10-CM | POA: Diagnosis not present

## 2023-07-10 DIAGNOSIS — Z720 Tobacco use: Secondary | ICD-10-CM

## 2023-07-10 DIAGNOSIS — G4733 Obstructive sleep apnea (adult) (pediatric): Secondary | ICD-10-CM | POA: Diagnosis not present

## 2023-07-10 NOTE — Progress Notes (Signed)
Medical screening examination/treatment was performed by qualified clinical staff member and as supervising provider I was immediately available for consultation/collaboration. I have reviewed documentation and agree with assessment and plan.  Thayer Ohm, DNP, APRN, FNP-BC [facility]

## 2023-07-10 NOTE — Progress Notes (Signed)
Subjective:  Patient ID: Jason Carson, male    DOB: 1951-01-28, 73 y.o.   MRN: [id number]  Patient Care Team: Christen Butter, NP as PCP - General (Nurse Practitioner) Chilton Si, MD as PCP - Cardiology (Cardiology)   Chief Complaint:  Hypertension   HPI:  Jason Carson is a 73 y.o. male presenting on 07/10/2023 for Hypertension   History, Exam,  Impression and Plan HPI  1. Essential hypertension (Primary) Pleasant elderly male presents for hypertension management follow-up today.  Followed and managed by cardiology advanced hypertension clinic.  Reports taking  medications as prescribed.  Patient reports blood pressure is most of the time less than systolic and less than diastolic.  Patient checks blood pressure multiple times daily.  Denies palpitations, chest pain, arm pain, denies lower extremity edema, or shortness of breath.  Bilateral clear breath sounds.  S1-S2 clear without regular beats or gallop. No changes to hypertension regimen today.     07/10/2023   10:39 AM 03/16/2023    2:43 PM 01/04/2023   10:17 AM  Vitals with BMI  Height 6\' 0"  6\' 0"  6\' 0"   Weight 193 lbs 200 lbs 199 lbs 1 oz  BMI 26.17 27.12 26.99  Systolic 118 134 119  Diastolic 68 70 77  Pulse 75 71 73     2. OSA on CPAP Patient denies using CPAP at home.  Patient keeping detailed record of blood pressures and states that CPAP did not improve his blood pressure.  Patient is not amenable to resuming CPAP because he cannot sleep with it.  States it  "makes him miserable".   3. Tobacco abuse Patient is a 1 pack/day for 30 years smoker.  Patient states that " I will eventually quit smoking" but declines further discussion today.  Patient is aware of the negative impact to his health.  Will continue to engage with during future visits smoking cessation discussions.  Continue all other maintenance medications.  Patient denies any other medical needs today.  Follow up plan: Return in  about 6 months (around 01/07/2024) for chronic disease follow up.  Relevant past medical, surgical, family, and social history reviewed and updated as indicated.  Allergies and medications reviewed and updated. Data reviewed: Chart in Epic.   Past Medical History:  Diagnosis Date   Atherosclerosis    Benign essential tremor    CAD in native artery 08/17/2021   Lower extremity edema 12/05/2021   Pure hypercholesterolemia 08/17/2021   RBBB 04/12/2021   Tobacco abuse 04/12/2021    Past Surgical History:  Procedure Laterality Date   ABDOMINAL AORTOGRAM W/LOWER EXTREMITY N/A 01/28/2020   Procedure: ABDOMINAL AORTOGRAM W/LOWER EXTREMITY;  Surgeon: Cephus Shelling, MD;  Location: [facility];  Service: Cardiovascular;  Laterality: N/A;   PERIPHERAL VASCULAR INTERVENTION Bilateral 01/28/2020   Procedure: PERIPHERAL VASCULAR INTERVENTION;  Surgeon: Cephus Shelling, MD;  Location: [facility];  Service: Cardiovascular;  Laterality: Bilateral;  external iliac stents    Social History   Socioeconomic History   Marital status: Single    Spouse name: Not on file   Number of children: Not on file   Years of education: Not on file   Highest education level: Not on file  Occupational History    Comment: Retired.  Tobacco Use   Smoking status: Every Day    Current packs/day: 1.00    Average packs/day: 1 pack/day for 30.0 years (30.0 ttl pk-yrs)    Types: Cigarettes  Smokeless tobacco: Never   Tobacco comments:    1 pack a day. ARJ 02/13/22  Vaping Use   Vaping status: Never Used  Substance and Sexual Activity   Alcohol use: Yes    Alcohol/week: 8.0 - 10.0 standard drinks of alcohol    Types: 8 - 10 Standard drinks or equivalent per week   Drug use: Never   Sexual activity: Yes  Other Topics Concern   Not on file  Social History Narrative   Patient did not want to answer   Social Drivers of Health   Financial Resource Strain: Low Risk  (12/08/2020)   Overall  Financial Resource Strain (CARDIA)    Difficulty of Paying Living Expenses: Not hard at all  Food Insecurity: No Food Insecurity (12/08/2020)   Hunger Vital Sign    Worried About Running Out of Food in the Last Year: Never true    Ran Out of Food in the Last Year: Never true  Transportation Needs: No Transportation Needs (12/08/2020)   PRAPARE - Administrator, Civil Service (Medical): No    Lack of Transportation (Non-Medical): No  Physical Activity: Inactive (12/08/2020)   Exercise Vital Sign    Days of Exercise per Week: 0 days    Minutes of Exercise per Session: 0 min  Stress: No Stress Concern Present (12/08/2020)   [employer] of Occupational Health - Occupational Stress Questionnaire    Feeling of Stress : Not at all  Social Connections: Unknown (12/08/2020)   Social Connection and Isolation Panel [NHANES]    Frequency of Communication with Friends and Family: More than three times a week    Frequency of Social Gatherings with Friends and Family: Three times a week    Attends Religious Services: Patient declined    Active Member of Clubs or Organizations: Patient declined    Attends Banker Meetings: Patient declined    Marital Status: Patient declined  Intimate Partner Violence: Not At Risk (12/08/2020)   Humiliation, Afraid, Rape, and Kick questionnaire    Fear of Current or Ex-Partner: No    Emotionally Abused: No    Physically Abused: No    Sexually Abused: No    Outpatient Encounter Medications as of 07/10/2023  Medication Sig   albuterol (VENTOLIN HFA) 108 (90 Base) MCG/ACT inhaler Inhale 2 puffs into the lungs every 4 (four) hours as needed for wheezing or shortness of breath.   AMBULATORY NON FORMULARY MEDICATION Continuous positive airway pressure (CPAP) machine set on AutoPAP (4-20 cmH2O), with all supplemental supplies as needed.   amLODipine (NORVASC) 5 MG tablet TAKE 1 TABLET (5 MG TOTAL) BY MOUTH IN THE MORNING AND AT BEDTIME   Aspirin  81 MG CAPS Take 81 mg by mouth daily.   atorvastatin (LIPITOR) 20 MG tablet TAKE 1 TABLET BY MOUTH EVERY DAY   Budeson-Glycopyrrol-Formoterol (BREZTRI AEROSPHERE) 160-9-4.8 MCG/ACT AERO Inhale 2 puffs into the lungs in the morning and at bedtime.   chlorhexidine (PERIDEX) 0.12 % solution 2 (two) times daily.   clopidogrel (PLAVIX) 75 MG tablet TAKE 1 TABLET BY MOUTH EVERY DAY   doxazosin (CARDURA) 8 MG tablet TAKE 1 TABLET BY MOUTH EVERYDAY AT BEDTIME   Multiple Vitamin (MULTIVITAMIN WITH MINERALS) TABS tablet Take 1 tablet by mouth daily.   valsartan (DIOVAN) 80 MG tablet TAKE 1 TABLET BY MOUTH EVERY DAY   No facility-administered encounter medications on file as of 07/10/2023.    No Known Allergies  Review of Systems  Objective:  BP 118/68 (BP Location: Left Arm, Cuff Size: Normal)   Pulse 75   Resp 20   Ht 6' (1.829 m)   Wt 193 lb (87.5 kg)   SpO2 96%   BMI 26.18 kg/m    Wt Readings from Last 3 Encounters:  07/10/23 193 lb (87.5 kg)  03/16/23 200 lb (90.7 kg)  01/04/23 199 lb 0.6 oz (90.3 kg)    Physical Exam  Results for orders placed or performed in visit on 01/04/23  CBC with Differential/Platelet   Collection Time: 01/04/23 10:43 AM  Result Value Ref Range   WBC 5.0 3.4 - 10.8 x10E3/uL   RBC 4.14 4.14 - 5.80 x10E6/uL   Hemoglobin 14.9 13.0 - 17.7 g/dL   Hematocrit 16.1 09.6 - 51.0 %   MCV 101 (H) 79 - 97 fL   MCH 36.0 (H) 26.6 - 33.0 pg   MCHC 35.5 31.5 - 35.7 g/dL   RDW 04.5 40.9 - 81.1 %   Platelets 224 150 - 450 x10E3/uL   Neutrophils 65 Not Estab. %   Lymphs 22 Not Estab. %   Monocytes 11 Not Estab. %   Eos 2 Not Estab. %   Basos 0 Not Estab. %   Neutrophils Absolute 3.2 1.4 - 7.0 x10E3/uL   Lymphocytes Absolute 1.1 0.7 - 3.1 x10E3/uL   Monocytes Absolute 0.6 0.1 - 0.9 x10E3/uL   EOS (ABSOLUTE) 0.1 0.0 - 0.4 x10E3/uL   Basophils Absolute 0.0 0.0 - 0.2 x10E3/uL   Immature Granulocytes 0 Not Estab. %   Immature Grans (Abs) 0.0 0.0 - 0.1 x10E3/uL   Lipid panel   Collection Time: 01/04/23 10:43 AM  Result Value Ref Range   Cholesterol, Total 149 100 - 199 mg/dL   Triglycerides 37 0 - 149 mg/dL   HDL 80 >91 mg/dL   VLDL Cholesterol Cal 9 5 - 40 mg/dL   LDL Chol Calc (NIH) 60 0 - 99 mg/dL   Chol/HDL Ratio 1.9 0.0 - 5.0 ratio  CMP14+EGFR   Collection Time: 01/04/23 10:43 AM  Result Value Ref Range   Glucose 98 70 - 99 mg/dL   BUN 9 8 - 27 mg/dL   Creatinine, Ser 4.78 0.76 - 1.27 mg/dL   eGFR 94 >29 FA/OZH/0.86   BUN/Creatinine Ratio 11 10 - 24   Sodium 132 (L) 134 - 144 mmol/L   Potassium 4.6 3.5 - 5.2 mmol/L   Chloride 94 (L) 96 - 106 mmol/L   CO2 23 20 - 29 mmol/L   Calcium 9.2 8.6 - 10.2 mg/dL   Total Protein 6.4 6.0 - 8.5 g/dL   Albumin 4.3 3.8 - 4.8 g/dL   Globulin, Total 2.1 1.5 - 4.5 g/dL   Bilirubin Total 0.6 0.0 - 1.2 mg/dL   Alkaline Phosphatase 88 44 - 121 IU/L   AST 19 0 - 40 IU/L   ALT 15 0 - 44 IU/L       Pertinent labs & imaging results that were available during my care of the patient were reviewed by me and considered in my medical decision making.   Continue healthy lifestyle choices, including diet (rich in fruits, vegetables, and lean proteins, and low in salt and simple carbohydrates) and exercise (at least 30 minutes of moderate physical activity daily).  The above assessment and management plan was discussed with the patient. The patient verbalized understanding of and has agreed to the management plan. Patient is aware to call the clinic if they develop any new symptoms or  if symptoms persist or worsen. Patient is aware when to return to the clinic for a follow-up visit. Patient educated on when it is appropriate to go to the emergency department.   Maryelizabeth Kaufmann Student AGNP

## 2023-07-15 ENCOUNTER — Other Ambulatory Visit: Payer: Self-pay | Admitting: Medical-Surgical

## 2023-07-27 ENCOUNTER — Other Ambulatory Visit (HOSPITAL_BASED_OUTPATIENT_CLINIC_OR_DEPARTMENT_OTHER): Payer: Self-pay | Admitting: Cardiovascular Disease

## 2023-07-29 ENCOUNTER — Other Ambulatory Visit: Payer: Self-pay | Admitting: Cardiovascular Disease

## 2023-08-21 ENCOUNTER — Other Ambulatory Visit: Payer: Self-pay

## 2023-08-21 MED ORDER — ATORVASTATIN CALCIUM 20 MG PO TABS: 20.0000 mg | ORAL_TABLET | Freq: Every day | ORAL | 0 refills | Status: DC

## 2023-08-22 ENCOUNTER — Telehealth: Payer: Self-pay | Admitting: Cardiovascular Disease

## 2023-08-22 NOTE — Telephone Encounter (Signed)
*  STAT* If patient is at the pharmacy, call can be transferred to refill team.   1. Which medications need to be refilled? (please list name of each medication and dose if known)   atorvastatin (LIPITOR) 20 MG tablet    2. Which pharmacy/location (including street and city if local pharmacy) is medication to be sent to?  [id number] [address] [place], [place] - [address]      3. Do they need a 30 day or 90 day supply? 90 day    Pt has office visit scheduled.

## 2023-08-23 MED ORDER — ATORVASTATIN CALCIUM 20 MG PO TABS: 20.0000 mg | ORAL_TABLET | Freq: Every day | ORAL | 0 refills | Status: DC

## 2023-09-14 ENCOUNTER — Other Ambulatory Visit: Payer: Self-pay | Admitting: Internal Medicine

## 2023-10-24 ENCOUNTER — Other Ambulatory Visit: Payer: Self-pay | Admitting: Cardiovascular Disease

## 2023-10-25 ENCOUNTER — Other Ambulatory Visit: Payer: Self-pay | Admitting: Vascular Surgery

## 2023-11-15 ENCOUNTER — Other Ambulatory Visit: Payer: Self-pay | Admitting: Vascular Surgery

## 2023-11-15 DIAGNOSIS — I70229 Atherosclerosis of native arteries of extremities with rest pain, unspecified extremity: Secondary | ICD-10-CM

## 2023-11-15 DIAGNOSIS — I739 Peripheral vascular disease, unspecified: Secondary | ICD-10-CM

## 2023-11-19 ENCOUNTER — Other Ambulatory Visit: Payer: Self-pay

## 2023-11-19 MED ORDER — VALSARTAN 80 MG PO TABS: 80.0000 mg | ORAL_TABLET | Freq: Every day | ORAL | 0 refills | Status: DC

## 2023-11-19 MED ORDER — AMLODIPINE BESYLATE 5 MG PO TABS: 5.0000 mg | ORAL_TABLET | Freq: Two times a day (BID) | ORAL | 0 refills | Status: DC

## 2023-11-28 ENCOUNTER — Ambulatory Visit (HOSPITAL_COMMUNITY)
Admission: RE | Admit: 2023-11-28 | Discharge: 2023-11-28 | Disposition: A | Discharge status: Home / Self Care | Source: Ambulatory Visit | Attending: Vascular Surgery | Admitting: Vascular Surgery

## 2023-11-28 ENCOUNTER — Ambulatory Visit: Admitting: Physician Assistant

## 2023-11-28 ENCOUNTER — Ambulatory Visit (HOSPITAL_COMMUNITY)
Admission: RE | Admit: 2023-11-28 | Discharge: 2023-11-28 | Discharge status: Home / Self Care | Source: Ambulatory Visit | Attending: Vascular Surgery

## 2023-11-28 VITALS — BP 135/70 | HR 68 | Temp 98.4°F | Ht 72.0 in | Wt 200.2 lb

## 2023-11-28 DIAGNOSIS — I70229 Atherosclerosis of native arteries of extremities with rest pain, unspecified extremity: Secondary | ICD-10-CM

## 2023-11-28 DIAGNOSIS — I739 Peripheral vascular disease, unspecified: Secondary | ICD-10-CM | POA: Diagnosis not present

## 2023-11-28 LAB — VAS US ABI WITH/WO TBI
Left ABI: 0.98
Right ABI: 0.64

## 2023-12-03 ENCOUNTER — Encounter (HOSPITAL_BASED_OUTPATIENT_CLINIC_OR_DEPARTMENT_OTHER): Payer: Self-pay | Admitting: Cardiovascular Disease

## 2023-12-03 NOTE — Progress Notes (Signed)
 Office Note   History of Present Illness   Kaimana Lurz is a 73 y.o. (11-29-1950) male who presents for surveillance of PAD. He has a history of bilateral external iliac artery angioplasty with stenting on 01/28/2020 by Dr.Clark. This was done for lifestyle limiting claudication.  He returns today for follow up. He is doing well at today's follow up. He denies any claudication, rest pain, or tissue loss. He does have ongoing chronic back pain.   Current Outpatient Medications  Medication Sig Dispense Refill   albuterol  (VENTOLIN  HFA) 108 (90 Base) MCG/ACT inhaler INHALE 2 PUFFS INTO THE LUNGS EVERY 4 HOURS AS NEEDED FOR WHEEZING OR SHORTNESS OF BREATH. 8.5 each 5   amLODipine  (NORVASC ) 5 MG tablet Take 1 tablet (5 mg total) by mouth in the morning and at bedtime. 180 tablet 0   Aspirin  81 MG CAPS Take 81 mg by mouth daily.     atorvastatin  (LIPITOR) 20 MG tablet Take 1 tablet (20 mg total) by mouth daily. 90 tablet 0   Budeson-Glycopyrrol-Formoterol  (BREZTRI  AEROSPHERE) 160-9-4.8 MCG/ACT AERO Inhale 2 puffs into the lungs in the morning and at bedtime. 1 each 5   chlorhexidine (PERIDEX) 0.12 % solution 2 (two) times daily.     clopidogrel  (PLAVIX ) 75 MG tablet TAKE 1 TABLET BY MOUTH EVERY DAY 90 tablet 2   doxazosin  (CARDURA ) 8 MG tablet TAKE 1 TABLET BY MOUTH EVERYDAY AT BEDTIME 90 tablet 1   Multiple Vitamin (MULTIVITAMIN WITH MINERALS) TABS tablet Take 1 tablet by mouth daily.     valsartan  (DIOVAN ) 80 MG tablet Take 1 tablet (80 mg total) by mouth daily. 90 tablet 0   AMBULATORY NON FORMULARY MEDICATION Continuous positive airway pressure (CPAP) machine set on AutoPAP (4-20 cmH2O), with all supplemental supplies as needed. 1 each 0   No current facility-administered medications for this visit.    REVIEW OF SYSTEMS (negative unless checked):   Cardiac:  []  Chest pain or chest pressure? []  Shortness of breath upon activity? []  Shortness of breath when lying flat? []  Irregular  heart rhythm?  Vascular:  []  Pain in calf, thigh, or hip brought on by walking? []  Pain in feet at night that wakes you up from your sleep? []  Blood clot in your veins? []  Leg swelling?  Pulmonary:  []  Oxygen at home? []  Productive cough? []  Wheezing?  Neurologic:  []  Sudden weakness in arms or legs? []  Sudden numbness in arms or legs? []  Sudden onset of difficult speaking or slurred speech? []  Temporary loss of vision in one eye? []  Problems with dizziness?  Gastrointestinal:  []  Blood in stool? []  Vomited blood?  Genitourinary:  []  Burning when urinating? []  Blood in urine?  Psychiatric:  []  Major depression  Hematologic:  []  Bleeding problems? []  Problems with blood clotting?  Dermatologic:  []  Rashes or ulcers?  Constitutional:  []  Fever or chills?  Ear/Nose/Throat:  []  Change in hearing? []  Nose bleeds? []  Sore throat?  Musculoskeletal:  [x]  Back pain? []  Joint pain? []  Muscle pain?   Physical Examination   Vitals:   11/28/23 1018  BP: 135/70  Pulse: 68  Temp: 98.4 F (36.9 C)  TempSrc: Temporal  SpO2: 92%  Weight: 200 lb 3.2 oz (90.8 kg)  Height: 6' (1.829 m)   Body mass index is 27.15 kg/m.  General:  WDWN in NAD; vital signs documented above Gait: Not observed HENT: WNL, normocephalic Pulmonary: normal non-labored breathing , without rales, rhonchi,  wheezing Cardiac: regular Abdomen: soft, NT,  no masses Skin: without rashes Vascular Exam/Pulses: palpable left DP pulse, nonpalpable right pedal pulses. Brisk right DP/PT doppler signals Extremities: without ischemic changes, without gangrene , without cellulitis; without open wounds;  Musculoskeletal: no muscle wasting or atrophy  Neurologic: A&O X 3;  No focal weakness or paresthesias are detected Psychiatric:  The pt has Normal affect.  Non-Invasive Vascular imaging   ABI (11/28/2023) R:  ABI: 0.64 (0.62),  PT: bi DP: mono TBI:  0.75 L:  ABI: 0.98 (0.99),  PT: tri DP:  bi TBI: 0.94  Aortoiliac Duplex (11/28/2023) Patent external iliac artery stents without stenosis   Medical Decision Making   Ehtan Delfavero is a 73 y.o. male who presents for surveillance of PAD  Based on the patient's vascular studies, his ABIs are essentially unchanged bilaterally. His right ABI is 0.64 and left ABI is 0.98 Arterial duplex demonstrates patent external iliac artery stents without stenosis He has a patent left DP pulse and brisk DP/PT doppler signals on the right He denies any claudication, rest pain, or tissue loss He can follow up with our office in 1 year with repeat ABIs and aortoiliac duplex   Ahmed Holster PA-C [facility] Office: [phone]  Call Md: [place]

## 2023-12-06 ENCOUNTER — Encounter (HOSPITAL_BASED_OUTPATIENT_CLINIC_OR_DEPARTMENT_OTHER): Payer: Self-pay | Admitting: Cardiovascular Disease

## 2023-12-06 ENCOUNTER — Ambulatory Visit (HOSPITAL_BASED_OUTPATIENT_CLINIC_OR_DEPARTMENT_OTHER): Admitting: Cardiovascular Disease

## 2023-12-06 VITALS — BP 128/58 | HR 91 | Ht 72.0 in | Wt 199.7 lb

## 2023-12-06 DIAGNOSIS — I4510 Unspecified right bundle-branch block: Secondary | ICD-10-CM

## 2023-12-06 DIAGNOSIS — E7800 Pure hypercholesterolemia, unspecified: Secondary | ICD-10-CM

## 2023-12-06 DIAGNOSIS — I10 Essential (primary) hypertension: Secondary | ICD-10-CM

## 2023-12-06 DIAGNOSIS — I2510 Atherosclerotic heart disease of native coronary artery without angina pectoris: Secondary | ICD-10-CM | POA: Diagnosis not present

## 2023-12-06 DIAGNOSIS — Z720 Tobacco use: Secondary | ICD-10-CM

## 2023-12-06 DIAGNOSIS — I70229 Atherosclerosis of native arteries of extremities with rest pain, unspecified extremity: Secondary | ICD-10-CM

## 2023-12-06 DIAGNOSIS — R0602 Shortness of breath: Secondary | ICD-10-CM

## 2023-12-06 DIAGNOSIS — G4733 Obstructive sleep apnea (adult) (pediatric): Secondary | ICD-10-CM

## 2023-12-06 MED ORDER — AMLODIPINE BESYLATE 5 MG PO TABS: 10.0000 mg | ORAL_TABLET | Freq: Every evening | ORAL | Status: DC

## 2023-12-06 NOTE — Patient Instructions (Signed)
 Medication Instructions:  CHANGE YOUR AMLODIPINE  TO 10 MG IN THE EVENING   *If you need a refill on your cardiac medications before your next appointment, please call your pharmacy*  Lab Work: BNP WHEN YOU HAVE YOUR LABS WITH PRIMARY CARE    Testing/Procedures: Your physician has requested that you have an echocardiogram. Echocardiography is a painless test that uses sound waves to create images of your heart. It provides your doctor with information about the size and shape of your heart and how well your heart's chambers and valves are working. This procedure takes approximately one hour. There are no restrictions for this procedure. Please do NOT wear cologne, perfume, aftershave, or lotions (deodorant is allowed). Please arrive 15 minutes prior to your appointment time.  Please note: We ask at that you not bring children with you during ultrasound (echo/ vascular) testing. Due to room size and safety concerns, children are not allowed in the ultrasound rooms during exams. Our front office staff cannot provide observation of children in our lobby area while testing is being conducted. An adult accompanying a patient to their appointment will only be allowed in the ultrasound room at the discretion of the ultrasound technician under special circumstances. We apologize for any inconvenience.  TO BE DONE AT [facility]   Follow-Up: At [facility], you and your health needs are our priority.  As part of our continuing mission to provide you with exceptional heart care, our providers are all part of one team.  This team includes your primary Cardiologist (physician) and Advanced Practice Providers or APPs (Physician Assistants and Nurse Practitioners) who all work together to provide you with the care you need, when you need it.  Your next appointment:   12 month(s)  Provider:   Annabella Scarce, MD, Rosaline Bane, NP, or Reche Finder, NP    We recommend signing up for  the patient portal called MyChart.  Sign up information is provided on this After Visit Summary.  MyChart is used to connect with patients for Virtual Visits (Telemedicine).  Patients are able to view lab/test results, encounter notes, upcoming appointments, etc.  Non-urgent messages can be sent to your provider as well.   To learn more about what you can do with MyChart, go to [email or web address].

## 2023-12-06 NOTE — Progress Notes (Signed)
 Cardiology Office Note:  .   Date:  12/06/2023  ID:  Jason Carson, DOB Aug 31, 1950, MRN [id number] PCP: Jason Mini, NP  [facility] Providers Cardiologist:  Jason Scarce, MD     History of Present Illness: .    Jason Carson is a 73 y.o. male with a hx of hypertension, PAD s/p bilateral iliac angioplasty and stenting, tobacco abuse, and sleep apnea (not consistently on CPAP) here for follow-up. He was initially seen 04/12/2021 to establish care in the [facility]. He last saw Carson Willo, NP on 02/09/21 for hypertension. He checked his blood pressure 2-3 times daily and noticed the readings were higher in the morning and normal or low later in the day. He continued to take amlodipine  2.5mg  daily and Cardura  8mg  nightly.  He was referred to the hypertension clinic for evaluation of potential causes to the morning surge in his blood pressure. He sees vascular surgery for PAD. He had stenting of the R and L external iliacs on 01/2020 and has no claudication.    At his initial visit he reported frequent nocturia. Amlodipine  was increased and switched to the evening to help with his elevated morning blood pressures. He was enrolled in our remote patient monitoring study. He followed up with our pharmacist and his blood pressures continued to be high in the mornings but were controlled in the evenings. It was noted that his home blood pressure cuff was reading higher than the in-office blood pressure cuff.  They decided to increase amlodipine  to 10 mg. On 05/2021 valsartan  was added. He followed up 06/2021 and his BP was average was at goal at home, though he did have some hypotensive episodes. They recommended switching his nicotine  gum to lozenges given his dental devices.  At his last in office visit his blood pressure was 173/89. Per his blood pressure log, his at home readings are typically higher in the mornings. He has checked using multiple machines [employer] and  [facility]) in the readings tended to run higher on the [employer].  At that time he was taking amlodipine  at night and in the morning, Valsartan  in the morning, and doxazosin  at night.  Valsartan  was switched to the evening.  He noted exertional dyspnea and given that he had coronary calcification, he was referred for a nuclear stress test 07/2021 that revealed LVEF 71% and no ischemia.  He was unable to reach target heart rate on the treadmill and was of converted to Lexiscan .  At his visit 04/2022 BP was improving and he was cutting back on smoking.  We discussed Wellbutrin for smoking cessation and he wanted to think about it. He last saw Vascular 11/2023 and had no claudication.  R ABI 0.64, L ).98, unchanged from prior.     Discussed the use of AI scribe software for clinical note transcription with the patient, who gave verbal consent to proceed.  History of Present Illness Mr. Jason Carson has been monitoring his blood pressure three times a day throughout June, noting lower readings in the evening compared to the morning. Morning readings average in the 130s/70s, while evening readings are in the 110s. He takes amlodipine  5 mg in the morning and 5 mg at night, doxazosin  at night, and valsartan  in the evening. Evening blood pressure readings are taken before his last dose of medication, which he takes around 9:30 or 10 PM. Morning readings are taken before his morning medications. He has a history of high blood pressure in the  morning and is concerned about the difference between his morning and evening readings. No consistent lightheadedness in the evenings, though occasionally feels a 'kind of feeling' when sitting, which he attributes to possibly dozing off.  He has a history of peripheral vascular disease and has undergone ankle-brachial indices (ABIs) and ultrasounds. He experiences leg pain and heaviness when walking, which resolves upon sitting. He describes his legs as  feeling 'very heavy and very tired' and experiences pain in the upper hip area. He has a history of lumbar vertebrae fusion. He has not pursued further treatment for his back pain but is interested in walking more despite the discomfort.  He experiences swelling in his calves and ankles, which is better in the morning and increases throughout the day. He finds that elevating his legs helps reduce the swelling. He does not currently use compression socks and has not been on a fluid pill recently.  He continues to smoke about half a pack of cigarettes a day.   ROS:  As per HPI  Studies Reviewed: .       Echo 11/2021: 1. Left ventricular ejection fraction, by estimation, is 60 to 65%. The  left ventricle has normal function. The left ventricle has no regional  wall motion abnormalities. There is mild concentric left ventricular  hypertrophy. Left ventricular diastolic  parameters are consistent with Grade I diastolic dysfunction (impaired  relaxation).   2. Right ventricular systolic function is normal. The right ventricular  size is mildly enlarged. There is normal pulmonary artery systolic  pressure.   3. The mitral valve is normal in structure. No evidence of mitral valve  regurgitation.   4. Tricuspid valve regurgitation is mild to moderate.   5. The aortic valve is tricuspid. Aortic valve regurgitation is not  visualized. No aortic stenosis is present.   6. The inferior vena cava is normal in size with greater than 50%  respiratory variability, suggesting right atrial pressure of 3 mmHg.   Risk Assessment/Calculations:             Physical Exam:   VS:  BP (!) 128/58 (BP Location: Right Arm, Patient Position: Sitting, Cuff Size: Normal)   Pulse 91   Ht 6' (1.829 m)   Wt 199 lb 11.2 oz (90.6 kg)   SpO2 90%   BMI 27.08 kg/m  , BMI Body mass index is 27.08 kg/m. GENERAL:  Well appearing HEENT: Pupils equal round and reactive, fundi not visualized, oral mucosa  unremarkable NECK:  No jugular venous distention, waveform within normal limits, carotid upstroke brisk and symmetric, no bruits, no thyromegaly LUNGS:  Clear to auscultation bilaterally HEART:  RRR.  PMI not displaced or sustained,S1 and S2 within normal limits, no S3, no S4, no clicks, no rubs, no murmurs ABD:  Flat, positive bowel sounds normal in frequency in pitch, no bruits, no rebound, no guarding, no midline pulsatile mass, no hepatomegaly, no splenomegaly EXT:  2 plus pulses throughout, no edema, no cyanosis no clubbing SKIN:  No rashes no nodules NEURO:  Cranial nerves II through XII grossly intact, motor grossly intact throughout PSYCH:  Cognitively intact, oriented to person place and time   ASSESSMENT AND PLAN: .    Assessment & Plan # Hypertension Blood pressure higher in the morning, lower in the evening. Current medications: amlodipine , doxazosin , valsartan . - Switch amlodipine  to 10 mg at night. - Monitor blood pressure for 4-5 days post-change and report via MyChart. - Consider 24-hour blood pressure monitoring if needed.  #  Peripheral vascular disease ABI indicates moderate right-side blockage.  Stable from prior.  His LE symptoms are more consistent with back pain than claudication. Leg symptoms likely due to lumbar fusion. - Continue aspirin  and Plavix . - Encourage exercise, such as using an exercise bike. - Consider referral to orthopedics for back pain management. -Continue follow up with Vascular Surgery - Consider switching ASA + clopidogrel  to ASA and Xarelto  # Edema Swelling in calves and ankles, improving in the morning. Possible venous insufficiency. Evaluating cardiac causes. - Order BNP lab test to assess for cardiac-related fluid retention. - Order echocardiogram to evaluate cardiac function. - Recommend use of compression socks. - Consider prescribing Lasix for significant swelling.  # Right bundle branch block Noted on EKG. No acute cardiac  events.  Stable from prior.   # Back pain due to lumbar vertebrae fusion Pain likely due to lumbar vertebrae fusion. Resolves with sitting. Leg symptoms possibly related to back issues. - Consider referral to orthopedics for further evaluation and management.  # Tobacco use Continues to smoke half a pack per day. Previous discussions on cessation options. - Consider Wellbutrin for smoking cessation if interested. He is not interested at this time.          Dispo: f/u 1 year  Signed, Jason Scarce, MD

## 2023-12-08 ENCOUNTER — Other Ambulatory Visit: Payer: Self-pay | Admitting: Cardiovascular Disease

## 2023-12-17 DIAGNOSIS — K080 Exfoliation of teeth due to systemic causes: Secondary | ICD-10-CM | POA: Diagnosis not present

## 2023-12-29 ENCOUNTER — Other Ambulatory Visit: Payer: Self-pay | Admitting: Medical-Surgical

## 2024-01-02 ENCOUNTER — Other Ambulatory Visit: Payer: Self-pay | Admitting: Internal Medicine

## 2024-01-07 ENCOUNTER — Encounter: Payer: Self-pay | Admitting: Medical-Surgical

## 2024-01-07 ENCOUNTER — Ambulatory Visit (INDEPENDENT_AMBULATORY_CARE_PROVIDER_SITE_OTHER): Payer: Medicare Other | Admitting: Medical-Surgical

## 2024-01-07 VITALS — BP 138/70 | HR 68 | Resp 20 | Ht 72.0 in | Wt 200.0 lb

## 2024-01-07 DIAGNOSIS — I2510 Atherosclerotic heart disease of native coronary artery without angina pectoris: Secondary | ICD-10-CM

## 2024-01-07 DIAGNOSIS — I10 Essential (primary) hypertension: Secondary | ICD-10-CM | POA: Diagnosis not present

## 2024-01-07 DIAGNOSIS — Z1211 Encounter for screening for malignant neoplasm of colon: Secondary | ICD-10-CM

## 2024-01-07 DIAGNOSIS — Z720 Tobacco use: Secondary | ICD-10-CM

## 2024-01-07 DIAGNOSIS — N433 Hydrocele, unspecified: Secondary | ICD-10-CM | POA: Insufficient documentation

## 2024-01-07 DIAGNOSIS — R0602 Shortness of breath: Secondary | ICD-10-CM | POA: Diagnosis not present

## 2024-01-07 DIAGNOSIS — E7800 Pure hypercholesterolemia, unspecified: Secondary | ICD-10-CM

## 2024-01-07 DIAGNOSIS — J449 Chronic obstructive pulmonary disease, unspecified: Secondary | ICD-10-CM

## 2024-01-07 MED ORDER — DOXAZOSIN MESYLATE 8 MG PO TABS
8.0000 mg | ORAL_TABLET | Freq: Every evening | ORAL | 1 refills | Status: DC
Start: 2024-01-07 — End: 2024-02-21

## 2024-01-07 NOTE — Progress Notes (Signed)
 Established patient visit   History of Present Illness   Discussed the use of AI scribe software for clinical note transcription with the patient, who gave verbal consent to proceed.  History of Present Illness   Jason Carson is a 73 year old male with hypertension and coronary artery disease who presents for follow-up on blood pressure management.  He monitors his blood pressure regularly, observing higher readings in the morning that decrease by the afternoon. Increasing his amlodipine  dosage from 5 mg to 10 mg in the evening has not significantly improved morning readings. He is on doxazosin  8 mg, which is currently not refilled at the pharmacy.  He takes Lipitor, Plavix , and uses a Breztri  inhaler. He is unsure of the inhaler's benefit and does not use his CPAP machine due to perceived lack of benefit. He has COPD but is not experiencing an acute exacerbation.  He experiences swelling in his feet and legs. He has a history of coronary artery disease and critical limb ischemia. No current issues with constipation. He has a left greater than right hydrocele and an epididymal cyst, with discomfort when bending over but no significant pain.      Physical Exam   Physical Exam Vitals and nursing note reviewed.  Constitutional:      General: He is not in acute distress.    Appearance: Normal appearance. He is not ill-appearing.  HENT:     Head: Normocephalic and atraumatic.  Cardiovascular:     Rate and Rhythm: Normal rate and regular rhythm.     Pulses: Normal pulses.     Heart sounds: Normal heart sounds. No murmur heard.    No friction rub. No gallop.  Pulmonary:     Effort: Pulmonary effort is normal. No respiratory distress.     Breath sounds: Normal breath sounds.  Skin:    General: Skin is warm and dry.  Neurological:     Mental Status: He is alert and oriented to person, place, and time.  Psychiatric:        Mood and Affect: Mood normal.        Behavior:  Behavior normal.        Thought Content: Thought content normal.        Judgment: Judgment normal.     Assessment & Plan   Assessment and Plan    Essential hypertension Blood pressure elevated in the morning despite medication adjustments. Concern for morning surge due to cardiovascular risk. - Continue amlodipine  10 mg in the evening. - Refill doxazosin  8 mg prescription. - Monitor blood pressure at different times of the day, including before getting out of bed.  Coronary artery disease - Continue Lipitor and Plavix . - Managed by Cardiology  Chronic obstructive pulmonary disease (COPD) and Tobacco abuse Well-managed with no acute exacerbation. - Continue Breztri  inhaler. - Recommend smoking cessation. - Scheduled for lung cancer screening.  Hyperlipidemia Managed with Lipitor. - Check lipids and CMP today.  Bilateral hydroceles and epididymal cyst Left greater than right hydrocele and epididymal cyst with discomfort when bending over. Worries that there have been changes.  - Order follow-up ultrasound of scrotum to assess hydroceles and epididymal cyst.   Colon cancer screening No prior colonoscopy and is not interested in doing one. Discussed other ways to screening for colon cancer.  - Cologuard ordered.      Follow up   Return in about 6 months (around 07/09/2024) for HTN follow up.  __________________________________ Zada FREDRIK Palin, DNP,  APRN, FNP-BC Primary Care and Sports Medicine [facility]

## 2024-01-08 ENCOUNTER — Ambulatory Visit: Payer: Self-pay | Admitting: Medical-Surgical

## 2024-01-08 LAB — CBC WITH DIFFERENTIAL/PLATELET
Basophils Absolute: 0 x10E3/uL (ref 0.0–0.2)
Basos: 0 %
EOS (ABSOLUTE): 0.1 x10E3/uL (ref 0.0–0.4)
Eos: 2 %
Hematocrit: 44.5 % (ref 37.5–51.0)
Hemoglobin: 15.7 g/dL (ref 13.0–17.7)
Immature Grans (Abs): 0 x10E3/uL (ref 0.0–0.1)
Immature Granulocytes: 0 %
Lymphocytes Absolute: 1.2 x10E3/uL (ref 0.7–3.1)
Lymphs: 21 %
MCH: 37.1 pg — ABNORMAL HIGH (ref 26.6–33.0)
MCHC: 35.3 g/dL (ref 31.5–35.7)
MCV: 105 fL — ABNORMAL HIGH (ref 79–97)
Monocytes Absolute: 0.6 x10E3/uL (ref 0.1–0.9)
Monocytes: 10 %
Neutrophils Absolute: 3.9 x10E3/uL (ref 1.4–7.0)
Neutrophils: 67 %
Platelets: 218 x10E3/uL (ref 150–450)
RBC: 4.23 x10E6/uL (ref 4.14–5.80)
RDW: 11.8 % (ref 11.6–15.4)
WBC: 5.9 x10E3/uL (ref 3.4–10.8)

## 2024-01-08 LAB — CMP14+EGFR
ALT: 20 IU/L (ref 0–44)
AST: 24 IU/L (ref 0–40)
Albumin: 4.4 g/dL (ref 3.8–4.8)
Alkaline Phosphatase: 86 IU/L (ref 44–121)
BUN/Creatinine Ratio: 12 (ref 10–24)
BUN: 10 mg/dL (ref 8–27)
Bilirubin Total: 0.5 mg/dL (ref 0.0–1.2)
CO2: 22 mmol/L (ref 20–29)
Calcium: 9.3 mg/dL (ref 8.6–10.2)
Chloride: 97 mmol/L (ref 96–106)
Creatinine, Ser: 0.84 mg/dL (ref 0.76–1.27)
Globulin, Total: 2.2 g/dL (ref 1.5–4.5)
Glucose: 99 mg/dL (ref 70–99)
Potassium: 4.8 mmol/L (ref 3.5–5.2)
Sodium: 134 mmol/L (ref 134–144)
Total Protein: 6.6 g/dL (ref 6.0–8.5)
eGFR: 92 mL/min/1.73 (ref >59)

## 2024-01-08 LAB — LIPID PANEL
Chol/HDL Ratio: 2 ratio (ref 0.0–5.0)
Cholesterol, Total: 157 mg/dL (ref 100–199)
HDL: 77 mg/dL (ref >39)
LDL Chol Calc (NIH): 70 mg/dL (ref 0–99)
Triglycerides: 48 mg/dL (ref 0–149)
VLDL Cholesterol Cal: 10 mg/dL (ref 5–40)

## 2024-01-08 LAB — BRAIN NATRIURETIC PEPTIDE: BNP: 45.2 pg/mL (ref 0.0–100.0)

## 2024-01-10 ENCOUNTER — Ambulatory Visit (HOSPITAL_BASED_OUTPATIENT_CLINIC_OR_DEPARTMENT_OTHER)
Admission: RE | Admit: 2024-01-10 | Discharge: 2024-01-10 | Disposition: A | Discharge status: Home / Self Care | Source: Ambulatory Visit | Attending: Cardiovascular Disease | Admitting: Cardiovascular Disease

## 2024-01-10 DIAGNOSIS — R0602 Shortness of breath: Secondary | ICD-10-CM | POA: Diagnosis not present

## 2024-01-10 LAB — ECHOCARDIOGRAM COMPLETE
AR max vel: 2.59 cm2
AV Area VTI: 2.49 cm2
AV Area mean vel: 2.68 cm2
AV Mean grad: 4 mmHg
AV Peak grad: 8.6 mmHg
Ao pk vel: 1.47 m/s
Area-P 1/2: 2.32 cm2
Calc EF: 63.6 %
S' Lateral: 2.6 cm
Single Plane A2C EF: 60.2 %
Single Plane A4C EF: 66.7 %

## 2024-01-14 ENCOUNTER — Ambulatory Visit: Payer: Self-pay | Admitting: Cardiovascular Disease

## 2024-01-14 NOTE — Progress Notes (Signed)
 Pt has been made aware of normal result and verbalized understanding.  jw

## 2024-01-15 ENCOUNTER — Ambulatory Visit (INDEPENDENT_AMBULATORY_CARE_PROVIDER_SITE_OTHER)

## 2024-01-15 DIAGNOSIS — Z87438 Personal history of other diseases of male genital organs: Secondary | ICD-10-CM | POA: Diagnosis not present

## 2024-01-15 DIAGNOSIS — Z09 Encounter for follow-up examination after completed treatment for conditions other than malignant neoplasm: Secondary | ICD-10-CM

## 2024-01-15 DIAGNOSIS — N433 Hydrocele, unspecified: Secondary | ICD-10-CM | POA: Diagnosis not present

## 2024-01-23 ENCOUNTER — Ambulatory Visit

## 2024-01-23 DIAGNOSIS — F17210 Nicotine dependence, cigarettes, uncomplicated: Secondary | ICD-10-CM

## 2024-01-23 DIAGNOSIS — Z87891 Personal history of nicotine dependence: Secondary | ICD-10-CM

## 2024-01-23 DIAGNOSIS — Z122 Encounter for screening for malignant neoplasm of respiratory organs: Secondary | ICD-10-CM | POA: Diagnosis not present

## 2024-01-30 DIAGNOSIS — Z1211 Encounter for screening for malignant neoplasm of colon: Secondary | ICD-10-CM | POA: Diagnosis not present

## 2024-02-04 ENCOUNTER — Other Ambulatory Visit: Payer: Self-pay

## 2024-02-04 DIAGNOSIS — Z122 Encounter for screening for malignant neoplasm of respiratory organs: Secondary | ICD-10-CM

## 2024-02-04 DIAGNOSIS — F17210 Nicotine dependence, cigarettes, uncomplicated: Secondary | ICD-10-CM

## 2024-02-04 DIAGNOSIS — Z87891 Personal history of nicotine dependence: Secondary | ICD-10-CM

## 2024-02-05 LAB — COLOGUARD: COLOGUARD: POSITIVE — AB

## 2024-02-16 ENCOUNTER — Other Ambulatory Visit: Payer: Self-pay | Admitting: Cardiovascular Disease

## 2024-02-18 ENCOUNTER — Other Ambulatory Visit: Payer: Self-pay

## 2024-02-19 MED ORDER — AMLODIPINE BESYLATE 5 MG PO TABS: 10.0000 mg | ORAL_TABLET | Freq: Every evening | ORAL | 3 refills | Status: DC

## 2024-02-20 ENCOUNTER — Encounter (INDEPENDENT_AMBULATORY_CARE_PROVIDER_SITE_OTHER): Payer: Self-pay | Admitting: Cardiovascular Disease

## 2024-02-20 ENCOUNTER — Other Ambulatory Visit: Payer: Self-pay

## 2024-02-20 DIAGNOSIS — I10 Essential (primary) hypertension: Secondary | ICD-10-CM

## 2024-02-20 MED ORDER — AMLODIPINE BESYLATE 10 MG PO TABS: 10.0000 mg | ORAL_TABLET | Freq: Every evening | ORAL | 3 refills | Status: AC

## 2024-02-20 NOTE — Telephone Encounter (Signed)
 Pt's medication amlodipine  was not sent to pt's pharmacy because it was on print. Please resend to pt's pharmacy. Thanks

## 2024-02-21 MED ORDER — DOXAZOSIN MESYLATE 4 MG PO TABS: 4.0000 mg | ORAL_TABLET | Freq: Two times a day (BID) | ORAL | Status: DC

## 2024-02-21 NOTE — Addendum Note (Signed)
 Addended by: FREDIRICK BEAU B on: 02/21/2024 01:49 PM   Modules accepted: Orders

## 2024-02-21 NOTE — Telephone Encounter (Signed)
 Blood pressures reviewed and uploaded from 09/2022-12/2023.  BP was checked three times daily for months.  We will continue with amlodipine  10mg  at night valsartan  80mg  daily.  Switch doxazosin  to 4mg  bid instead of 8mg  at bedtime given that BP is consistently elevated in the AM and low at night.    Michelene Keniston C. Raford, MD, FACC 02/21/2024 11:01 AM

## 2024-02-22 MED ORDER — DOXAZOSIN MESYLATE 4 MG PO TABS: 4.0000 mg | ORAL_TABLET | Freq: Two times a day (BID) | ORAL | 3 refills | Status: AC

## 2024-02-22 NOTE — Addendum Note (Signed)
 Addended by: FREDIRICK NEWELL NOVAK on: 02/22/2024 09:48 AM   Modules accepted: Orders

## 2024-07-09 ENCOUNTER — Ambulatory Visit: Admitting: Medical-Surgical
